# Patient Record
Sex: Female | Born: 2000 | Race: Black or African American | Marital: Single | Smoking: Light tobacco smoker
Health system: Southern US, Community
[De-identification: ages and names within clinical notes are randomized; demographics above are authoritative.]

## PROBLEM LIST (undated history)

## (undated) DIAGNOSIS — T7840XA Allergy, unspecified, initial encounter: Secondary | ICD-10-CM

## (undated) HISTORY — DX: Allergy, unspecified, initial encounter: T78.40XA

---
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
## 2019-07-11 DIAGNOSIS — M79671 Pain in right foot: Secondary | ICD-10-CM | POA: Diagnosis not present

## 2019-12-22 ENCOUNTER — Encounter: Payer: Self-pay | Admitting: Family Medicine

## 2019-12-22 ENCOUNTER — Ambulatory Visit (INDEPENDENT_AMBULATORY_CARE_PROVIDER_SITE_OTHER): Payer: BC Managed Care – PPO | Admitting: Family Medicine

## 2019-12-22 ENCOUNTER — Other Ambulatory Visit: Payer: Self-pay

## 2019-12-22 VITALS — BP 112/72 | HR 64 | Temp 98.6°F | Resp 20 | Ht 69.0 in | Wt 166.0 lb

## 2019-12-22 DIAGNOSIS — M25562 Pain in left knee: Secondary | ICD-10-CM

## 2019-12-22 DIAGNOSIS — G8929 Other chronic pain: Secondary | ICD-10-CM

## 2019-12-22 DIAGNOSIS — M25561 Pain in right knee: Secondary | ICD-10-CM | POA: Diagnosis not present

## 2019-12-22 MED ORDER — DICLOFENAC SODIUM 1 % EX GEL: 2.0000 g | Freq: Four times a day (QID) | CUTANEOUS | 1 refills | Status: DC

## 2019-12-22 MED ORDER — PREDNISONE 20 MG PO TABS
ORAL_TABLET | ORAL | 0 refills | Status: DC
Start: 2019-12-22 — End: 2020-02-04

## 2019-12-22 NOTE — Progress Notes (Signed)
Subjective:  Patient ID: Katherine Dean, female    DOB: 08/15/2001, 18 y.o.   MRN: [id number]  Patient Care Team: Sonny Mastersakes, Criselda Starke M, FNP as PCP - General (Family Medicine)   Chief Complaint:  Establish Care (New ) and bilateral knee pain (L > R)   HPI: Katherine Dean is a 18 y.o. female presenting on 12/22/2019 for Establish Care (New ) and bilateral knee pain (L > R)   Pt presents today to establish care. Pt was seeing Dr. Lysbeth GalasNyland who has retired. Pt states she is doing well overall. States she has ongoing knee pain that started after a basketball injury in 2012. She had a left knee MRI at that time which was unremarkable. Pt states her left knee hurts worse than the right and swells at times. States she has burning and stinging pain after standing on her feet for long hours. She denies new injury, loss of function, decreased ROM. No redness or increased warmth.   Knee Pain  The incident occurred more than 1 week ago. The pain is present in the left knee and right knee. The quality of the pain is described as aching, burning, shooting and stabbing. The pain is at a severity of 5/10. The pain is moderate. The pain has been intermittent since onset. Pertinent negatives include no inability to bear weight, loss of motion, loss of sensation, muscle weakness, numbness or tingling. She reports no foreign bodies present. The symptoms are aggravated by movement and weight bearing. She has tried NSAIDs, heat, ice and immobilization for the symptoms. The treatment provided mild relief.     Relevant past medical, surgical, family, and social history reviewed and updated as indicated.  Allergies and medications reviewed and updated. Date reviewed: Chart in Epic.   History reviewed. No pertinent past medical history.  History reviewed. No pertinent surgical history.  Social History   Socioeconomic History  . Marital status: Single    Spouse name: Not on file  . Number of children: Not on file  .  Years of education: Not on file  . Highest education level: Not on file  Occupational History  . Not on file  Tobacco Use  . Smoking status: Light Tobacco Smoker    Types: Cigars  . Smokeless tobacco: Never Used  Substance and Sexual Activity  . Alcohol use: Never  . Drug use: Never  . Sexual activity: Not on file  Other Topics Concern  . Not on file  Social History Narrative  . Not on file   Social Determinants of Health   Financial Resource Strain:   . Difficulty of Paying Living Expenses: Not on file  Food Insecurity:   . Worried About Programme researcher, broadcasting/film/videounning Out of Food in the Last Year: Not on file  . Ran Out of Food in the Last Year: Not on file  Transportation Needs:   . Lack of Transportation (Medical): Not on file  . Lack of Transportation (Non-Medical): Not on file  Physical Activity:   . Days of Exercise per Week: Not on file  . Minutes of Exercise per Session: Not on file  Stress:   . Feeling of Stress : Not on file  Social Connections:   . Frequency of Communication with Friends and Family: Not on file  . Frequency of Social Gatherings with Friends and Family: Not on file  . Attends Religious Services: Not on file  . Active Member of Clubs or Organizations: Not on file  . Attends BankerClub or Organization Meetings:  Not on file  . Marital Status: Not on file  Intimate Partner Violence:   . Fear of Current or Ex-Partner: Not on file  . Emotionally Abused: Not on file  . Physically Abused: Not on file  . Sexually Abused: Not on file    Outpatient Encounter Medications as of 12/22/2019  Medication Sig  . diclofenac Sodium (VOLTAREN) 1 % GEL Apply 2 g topically 4 (four) times daily.  . predniSONE (DELTASONE) 20 MG tablet 2 po at sametime daily for 5 days   No facility-administered encounter medications on file as of 12/22/2019.    No Known Allergies  Review of Systems  Constitutional: Negative for activity change, appetite change, chills, diaphoresis, fatigue, fever and  unexpected weight change.  HENT: Negative.   Eyes: Negative.  Negative for photophobia and visual disturbance.  Respiratory: Negative for cough, chest tightness and shortness of breath.   Cardiovascular: Negative for chest pain, palpitations and leg swelling.  Gastrointestinal: Negative for abdominal pain, blood in stool, constipation, diarrhea, nausea and vomiting.  Endocrine: Negative.   Genitourinary: Negative for decreased urine volume, difficulty urinating, dysuria, frequency, urgency, vaginal bleeding, vaginal discharge and vaginal pain.  Musculoskeletal: Positive for arthralgias and joint swelling. Negative for back pain, gait problem, myalgias, neck pain and neck stiffness.  Skin: Negative.  Negative for color change, pallor, rash and wound.  Allergic/Immunologic: Negative.   Neurological: Negative for dizziness, tingling, tremors, seizures, syncope, facial asymmetry, speech difficulty, weakness, light-headedness, numbness and headaches.  Hematological: Negative.   Psychiatric/Behavioral: Negative for confusion, hallucinations, sleep disturbance and suicidal ideas.  All other systems reviewed and are negative.       Objective:  BP 112/72   Pulse 64   Temp 98.6 F (37 C)   Resp 20   Ht 5\' 9"  (1.753 m)   Wt 166 lb (75.3 kg)   LMP 12/20/2019   SpO2 94%   BMI 24.51 kg/m    Wt Readings from Last 3 Encounters:  12/22/19 166 lb (75.3 kg)    Physical Exam Vitals and nursing note reviewed.  Constitutional:      General: She is not in acute distress.    Appearance: Normal appearance. She is well-developed and well-groomed. She is not ill-appearing, toxic-appearing or diaphoretic.  HENT:     Head: Normocephalic and atraumatic.     Jaw: There is normal jaw occlusion.     Right Ear: Hearing normal.     Left Ear: Hearing normal.     Nose: Nose normal.     Mouth/Throat:     Lips: Pink.     Mouth: Mucous membranes are moist.     Pharynx: Oropharynx is clear. Uvula midline.    Eyes:     General: Lids are normal.     Extraocular Movements: Extraocular movements intact.     Conjunctiva/sclera: Conjunctivae normal.     Pupils: Pupils are equal, round, and reactive to light.  Neck:     Thyroid: No thyroid mass, thyromegaly or thyroid tenderness.     Vascular: No carotid bruit or JVD.     Trachea: Trachea and phonation normal.  Cardiovascular:     Rate and Rhythm: Normal rate and regular rhythm.     Chest Wall: PMI is not displaced.     Pulses: Normal pulses.     Heart sounds: Normal heart sounds. No murmur. No friction rub. No gallop.   Pulmonary:     Effort: Pulmonary effort is normal. No respiratory distress.     Breath  sounds: Normal breath sounds. No wheezing.  Abdominal:     General: Bowel sounds are normal. There is no distension or abdominal bruit.     Palpations: Abdomen is soft. There is no hepatomegaly or splenomegaly.     Tenderness: There is no abdominal tenderness. There is no right CVA tenderness or left CVA tenderness.     Hernia: No hernia is present.  Musculoskeletal:        General: Normal range of motion.     Cervical back: Normal range of motion and neck supple.     Right upper leg: Normal.     Left upper leg: Normal.     Right knee: No swelling, deformity, effusion, erythema, ecchymosis, lacerations, bony tenderness or crepitus. Normal range of motion. Tenderness present over the medial joint line. No LCL laxity, MCL laxity, ACL laxity or PCL laxity. Normal alignment, normal meniscus and normal patellar mobility. Normal pulse.     Instability Tests: Negative medial McMurray test and negative lateral McMurray test.     Left knee: Swelling present. No deformity, effusion, erythema, ecchymosis, lacerations, bony tenderness or crepitus. Normal range of motion. Tenderness present over the lateral joint line. No LCL laxity, MCL laxity, ACL laxity or PCL laxity.Normal alignment, normal meniscus and normal patellar mobility. Normal pulse.      Instability Tests: Negative medial McMurray test and negative lateral McMurray test.     Right lower leg: Normal. No edema.     Left lower leg: Normal. No edema.  Lymphadenopathy:     Cervical: No cervical adenopathy.  Skin:    General: Skin is warm and dry.     Capillary Refill: Capillary refill takes less than 2 seconds.     Coloration: Skin is not cyanotic, jaundiced or pale.     Findings: No rash.  Neurological:     General: No focal deficit present.     Mental Status: She is alert and oriented to person, place, and time.     Cranial Nerves: Cranial nerves are intact. No cranial nerve deficit.     Sensory: Sensation is intact. No sensory deficit.     Motor: Motor function is intact. No weakness.     Coordination: Coordination is intact. Coordination normal.     Gait: Gait is intact. Gait normal.     Deep Tendon Reflexes: Reflexes are normal and symmetric. Reflexes normal.  Psychiatric:        Attention and Perception: Attention and perception normal.        Mood and Affect: Mood and affect normal.        Speech: Speech normal.        Behavior: Behavior normal. Behavior is cooperative.        Thought Content: Thought content normal.        Cognition and Memory: Cognition and memory normal.        Judgment: Judgment normal.     No results found for this or any previous visit.     Pertinent labs & imaging results that were available during my care of the patient were reviewed by me and considered in my medical decision making.  Assessment & Plan:  Francisca was seen today for establish care and bilateral knee pain.  Diagnoses and all orders for this visit:  Chronic pain of both knees Ongoing bilateral knee pain for several years. Worsens when on feet for over 8 hours. Unable to take NSAIDs orally as they upset her stomach. Will apply bilateral braces today and initiate  topical NSAIDs along with a burst of steroids. Pt to follow up in 6 weeks for reevaluation. May require  imaging and referral to PT and ortho.  -     predniSONE (DELTASONE) 20 MG tablet; 2 po at sametime daily for 5 days -     diclofenac Sodium (VOLTAREN) 1 % GEL; Apply 2 g topically 4 (four) times daily.     Continue all other maintenance medications.  Follow up plan: Return in about 6 weeks (around 02/02/2020), or if symptoms worsen or fail to improve, for knee pain.  Continue healthy lifestyle choices, including diet (rich in fruits, vegetables, and lean proteins, and low in salt and simple carbohydrates) and exercise (at least 30 minutes of moderate physical activity daily).  Educational handout given for knee pain  The above assessment and management plan was discussed with the patient. The patient verbalized understanding of and has agreed to the management plan. Patient is aware to call the clinic if they develop any new symptoms or if symptoms persist or worsen. Patient is aware when to return to the clinic for a follow-up visit. Patient educated on when it is appropriate to go to the emergency department.   Kari Baars, FNP-C [facility] [phone]

## 2019-12-22 NOTE — Patient Instructions (Signed)

## 2019-12-30 ENCOUNTER — Telehealth: Payer: Self-pay | Admitting: Family Medicine

## 2019-12-30 NOTE — Telephone Encounter (Signed)
A referral can be placed for ortho. Does she have a preference?

## 2019-12-30 NOTE — Telephone Encounter (Signed)
Patient states the prednisone does not work at all. The valtren gel was to expensive so she got the pharmacisit to recommend something otc. Patients knee pain is no better. Patient states something needs to be done she has never seen ortho or physical therapy.

## 2019-12-31 NOTE — Telephone Encounter (Signed)
Referral placed.

## 2019-12-31 NOTE — Addendum Note (Signed)
Addended by: Sonny Masters on: 12/31/2019 01:02 PM   Modules accepted: Orders

## 2019-12-31 NOTE — Telephone Encounter (Signed)
Patient states she would like to see someone in the [place] or [place] area.

## 2020-01-02 ENCOUNTER — Ambulatory Visit (INDEPENDENT_AMBULATORY_CARE_PROVIDER_SITE_OTHER): Payer: Self-pay | Admitting: Family Medicine

## 2020-01-02 ENCOUNTER — Other Ambulatory Visit: Payer: Self-pay

## 2020-01-02 ENCOUNTER — Encounter: Payer: Self-pay | Admitting: Family Medicine

## 2020-01-02 DIAGNOSIS — M545 Low back pain, unspecified: Secondary | ICD-10-CM

## 2020-01-02 DIAGNOSIS — M25561 Pain in right knee: Secondary | ICD-10-CM

## 2020-01-02 DIAGNOSIS — M25562 Pain in left knee: Secondary | ICD-10-CM

## 2020-01-02 DIAGNOSIS — G8929 Other chronic pain: Secondary | ICD-10-CM

## 2020-01-02 MED ORDER — TIZANIDINE HCL 2 MG PO TABS: 2.0000 mg | ORAL_TABLET | Freq: Four times a day (QID) | ORAL | 1 refills | Status: DC | PRN

## 2020-01-02 MED ORDER — NABUMETONE 750 MG PO TABS: 750.0000 mg | ORAL_TABLET | Freq: Two times a day (BID) | ORAL | 6 refills | Status: DC | PRN

## 2020-01-02 NOTE — Progress Notes (Signed)
Office Visit Note   Patient: Katherine Dean           Date of Birth: [birth year]           MRN: [id number] Visit Date: 01/02/2020 Requested by: Baruch Gouty, [place],  [place] [address] PCP: Baruch Gouty, FNP  Subjective: Chief Complaint  Patient presents with  . Left Knee - Pain    Chronic pain in the left knee. NKI. Pain all over knee. Has had intermittent pain in the knee since she was in 7th grade. It has gotten worse, especially with working her current job.  . Right Knee - Pain    Pain in posterior knee, starting 2 weeks ago.     HPI: She is here with left greater than right knee pain and low back pain.  In 2012 she developed left knee pain while playing basketball, no definite injury.  She had an MRI scan which showed some bone contusions but no other abnormality.  Over the years she has had intermittent pain in the knee but in the past year it has become more constant now that she is working for [employer].  Her job involves standing for 10 to 12 hours per shift on hard floors.  She has to lean on her right leg because of her left knee pain, and now her right is bothering her almost as much as the left.  She also reports lower lumbar back pain while standing all day at work.  She states that her knee pain is mostly on the posterior aspect but occasionally on the anterior.  No popping or locking, no giving way.  She has not taken medications and has not sought treatment for this since her original injury.               ROS: No fevers or chills.  All other systems were reviewed and are negative.  Objective: Vital Signs: LMP 12/20/2019   Physical Exam:  General:  Alert and oriented, in no acute distress. Pulm:  Breathing unlabored. Psy:  Normal mood, congruent affect. Skin: No rash. Low back: She has hyperlordosis of the lumbar spine.  She is slightly tender near the L5-S1 level.  Stork test is equivocal, straight leg raise is negative.  Lower extremity strength is  normal. Hips: She has limited internal rotation of just over 30 degrees bilaterally.  External rotation is about 70 degrees. Knees: She has wide Q angles bilaterally.  No effusions, ligaments are stable.  No patellofemoral crepitus, no significant joint line tenderness, no pain or click with McMurray's. Feet: She has semirigid pes planus with hyperpronation on weightbearing.  This exaggerates her Q angles.  Imaging: None today  Assessment & Plan: 1.  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  Cannot rule out referred pain from lumbar radiculopathy or possibly a spondylolisthesis. -We will take her out of work for 3 weeks and start physical therapy.  Relafen and Zanaflex as needed. -If she is doing well at 3 weeks, she will go back to work 8-hour shifts for 3 more weeks.  If she tolerates that, then regular duty after that. -If she fails to improve we will obtain three-view bilateral knee x-rays and lumbar x-rays with obliques, possibly do MRI scanning as well depending on the results.       Procedures: No procedures performed  No notes on file     PMFS History: Patient Active Problem  List   Diagnosis Date Noted  . Chronic pain of both knees 12/22/2019   History reviewed. No pertinent past medical history.  Family History  Problem Relation Age of Onset  . Heart attack Brother     History reviewed. No pertinent surgical history. Social History   Occupational History  . Not on file  Tobacco Use  . Smoking status: Light Tobacco Smoker    Types: Cigars  . Smokeless tobacco: Never Used  Substance and Sexual Activity  . Alcohol use: Never  . Drug use: Never  . Sexual activity: Not on file

## 2020-01-07 DIAGNOSIS — M222X2 Patellofemoral disorders, left knee: Secondary | ICD-10-CM | POA: Diagnosis not present

## 2020-01-07 DIAGNOSIS — M222X1 Patellofemoral disorders, right knee: Secondary | ICD-10-CM | POA: Diagnosis not present

## 2020-01-09 DIAGNOSIS — M222X1 Patellofemoral disorders, right knee: Secondary | ICD-10-CM | POA: Diagnosis not present

## 2020-01-09 DIAGNOSIS — M222X2 Patellofemoral disorders, left knee: Secondary | ICD-10-CM | POA: Diagnosis not present

## 2020-01-13 DIAGNOSIS — M222X1 Patellofemoral disorders, right knee: Secondary | ICD-10-CM | POA: Diagnosis not present

## 2020-01-13 DIAGNOSIS — M222X2 Patellofemoral disorders, left knee: Secondary | ICD-10-CM | POA: Diagnosis not present

## 2020-01-15 DIAGNOSIS — M222X1 Patellofemoral disorders, right knee: Secondary | ICD-10-CM | POA: Diagnosis not present

## 2020-01-15 DIAGNOSIS — M222X2 Patellofemoral disorders, left knee: Secondary | ICD-10-CM | POA: Diagnosis not present

## 2020-01-20 DIAGNOSIS — M222X1 Patellofemoral disorders, right knee: Secondary | ICD-10-CM | POA: Diagnosis not present

## 2020-01-20 DIAGNOSIS — M222X2 Patellofemoral disorders, left knee: Secondary | ICD-10-CM | POA: Diagnosis not present

## 2020-01-22 DIAGNOSIS — M222X1 Patellofemoral disorders, right knee: Secondary | ICD-10-CM | POA: Diagnosis not present

## 2020-01-22 DIAGNOSIS — M222X2 Patellofemoral disorders, left knee: Secondary | ICD-10-CM | POA: Diagnosis not present

## 2020-01-27 ENCOUNTER — Ambulatory Visit: Payer: Self-pay

## 2020-01-27 ENCOUNTER — Encounter: Payer: Self-pay | Admitting: Family Medicine

## 2020-01-27 ENCOUNTER — Ambulatory Visit (INDEPENDENT_AMBULATORY_CARE_PROVIDER_SITE_OTHER): Payer: 59 | Admitting: Family Medicine

## 2020-01-27 ENCOUNTER — Other Ambulatory Visit: Payer: Self-pay

## 2020-01-27 DIAGNOSIS — M25561 Pain in right knee: Secondary | ICD-10-CM

## 2020-01-27 DIAGNOSIS — M25562 Pain in left knee: Secondary | ICD-10-CM | POA: Diagnosis not present

## 2020-01-27 DIAGNOSIS — M222X2 Patellofemoral disorders, left knee: Secondary | ICD-10-CM | POA: Diagnosis not present

## 2020-01-27 DIAGNOSIS — G8929 Other chronic pain: Secondary | ICD-10-CM | POA: Diagnosis not present

## 2020-01-27 DIAGNOSIS — M222X1 Patellofemoral disorders, right knee: Secondary | ICD-10-CM | POA: Diagnosis not present

## 2020-01-27 MED ORDER — CELECOXIB 200 MG PO CAPS: 200.0000 mg | ORAL_CAPSULE | Freq: Two times a day (BID) | ORAL | 6 refills | Status: DC | PRN

## 2020-01-27 NOTE — Progress Notes (Signed)
I saw and examined the patient with Dr. Robby Sermon and agree with assessment and plan as outlined.  Temporary improvement with PT, but still having a lot of trouble at work.    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  Knees have patellofemoral maltracking.    Will continue with PT and home exercises; try Hapad arch supports (possible custom orthotics).    Consider new MRI scans if not improved in a few months.

## 2020-01-27 NOTE — Progress Notes (Signed)
Office Visit Note   Patient: Katherine Dean           Date of Birth: 02/23/2001           MRN: [id number] Visit Date: 01/27/2020 Requested by: Baruch Gouty, [place],  [place] [address] PCP: Baruch Gouty, FNP  Subjective: Chief Complaint  Patient presents with  . Left Knee - Pain    "knee is the same"  PT helps the day of, but symptoms come back    HPI: She is for follow up of bilateral knee pain, left worse than right.    Chronic knee pain since 2012 while playing basketball. No definite injury but she did report intermittent swelling in knee. MRI obtained at the time which showed bone contusions, no other abnormality. Pain has been intermittent, more constant since starteing work at [employer] as she stands for 10 hours on hard floors and leans on right leg as left knee has had more pain. She has been going to PT and overall feels like things are about the same. Pain is a little better on day of therapy. She has been taking Tizanidine and Relafen but is unsure if it is helping.            ROS: No fevers or chills.  All other systems were reviewed and are negative.  Objective: Vital Signs: There were no vitals taken for this visit.  Physical Exam:  General:  Alert and oriented, in no acute distress. Pulm:  Breathing unlabored. Psy:  Normal mood, congruent affect. Skin: No rash. Low back: She has hyperlordosis of the lumbar spine.  She is slightly tender near the L5-S1 level.  Stork test is equivocal, straight leg raise is negative.  Lower extremity strength is normal. Hips: IR to 45 degrees on left side, 40 degrees on right side, ER rotation ~70 degrees. Knees: She has wide Q angles bilaterally.  No effusions, ligaments are stable.  No patellofemoral crepitus, no significant joint line tenderness, no pain or click with McMurray's. Feet: She has semirigid pes planus with hyperpronation on weightbearing.    Gait: genus valgus, hyperpronation of feet. Slight antalgic  gait favoring left side  Imaging: None today  Assessment & Plan: Chronic bilateral knee pain- patellofemoral  1.  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  Cannot rule out referred pain from lumbar radiculopathy or possibly a spondylolisthesis. -We will take her out of work for 3 weeks and start physical therapy.  Relafen and Zanaflex as needed. -If she is doing well at 3 weeks, she will go back to work 8-hour shifts for 3 more weeks.  If she tolerates that, then regular duty after that. -If she fails to improve we will obtain three-view bilateral knee x-rays and lumbar x-rays with obliques, possibly do MRI scanning as well depending on the results.   Procedures: No procedures performed  No notes on file     PMFS History: Patient Active Problem List   Diagnosis Date Noted  . Chronic pain of both knees 12/22/2019   History reviewed. No pertinent past medical history.  Family History  Problem Relation Age of Onset  . Heart attack Brother     History reviewed. No pertinent surgical history. Social History   Occupational History  . Not on file  Tobacco Use  . Smoking status: Light Tobacco Smoker    Types: Cigars  . Smokeless tobacco: Never Used  Substance and  Sexual Activity  . Alcohol use: Never  . Drug use: Never  . Sexual activity: Not on file

## 2020-01-29 DIAGNOSIS — M222X1 Patellofemoral disorders, right knee: Secondary | ICD-10-CM | POA: Diagnosis not present

## 2020-01-29 DIAGNOSIS — M222X2 Patellofemoral disorders, left knee: Secondary | ICD-10-CM | POA: Diagnosis not present

## 2020-02-03 DIAGNOSIS — M222X1 Patellofemoral disorders, right knee: Secondary | ICD-10-CM | POA: Diagnosis not present

## 2020-02-03 DIAGNOSIS — M222X2 Patellofemoral disorders, left knee: Secondary | ICD-10-CM | POA: Diagnosis not present

## 2020-02-04 ENCOUNTER — Encounter: Payer: Self-pay | Admitting: Family Medicine

## 2020-02-04 ENCOUNTER — Other Ambulatory Visit: Payer: Self-pay

## 2020-02-04 ENCOUNTER — Ambulatory Visit (INDEPENDENT_AMBULATORY_CARE_PROVIDER_SITE_OTHER): Payer: BC Managed Care – PPO | Admitting: Family Medicine

## 2020-02-04 DIAGNOSIS — M545 Low back pain, unspecified: Secondary | ICD-10-CM

## 2020-02-04 DIAGNOSIS — G8929 Other chronic pain: Secondary | ICD-10-CM | POA: Diagnosis not present

## 2020-02-04 DIAGNOSIS — M25561 Pain in right knee: Secondary | ICD-10-CM

## 2020-02-04 DIAGNOSIS — M25562 Pain in left knee: Secondary | ICD-10-CM

## 2020-02-04 NOTE — Progress Notes (Signed)
   Office Visit Note   Patient: Katherine Dean           Date of Birth: [birth year]           MRN: [id number] Visit Date: 02/04/2020 Requested by: Sonny Masters, FNP [address] [place],  [place] [address] PCP: Sonny Masters, FNP  Subjective: Chief Complaint  Patient presents with  . Lower Back - Pain, Follow-up    Pain is improving with PT - goes every Tues & Thurs. Still has the pain in the right lower back.  . Left Knee - Pain, Follow-up    Knee is not improving much yet.    HPI: She is here for follow-up chronic low back pain and bilateral knee pain.  Since last visit she has been doing physical therapy.  She feels like it is helping to some degree with her low back pain, and may be a little bit with her knees.  They are doing taping of her knees and that makes them feel better.  She tried going back to work with modified hours, and tolerated 2 days but the third day she had to stay home.  She does not think she will be able to do her current job at this point, until she feels better.              ROS: No fevers or chills.  All other systems were reviewed and are negative.  Objective: Vital Signs: There were no vitals taken for this visit.  Physical Exam:  General:  Alert and oriented, in no acute distress. Pulm:  Breathing unlabored. Psy:  Normal mood, congruent affect. Low back: Tender to palpation in the paraspinous muscles to the right of midline at about L3-4. Knees: No effusion, trace patellofemoral crepitus.  Mild pain with patellar compression on both sides.  Full range of motion.  Imaging: None today.  Assessment & Plan: 1.  Chronic low back pain, possibly myofascial but cannot rule out facet syndrome -MRI to evaluate.  Continue with physical therapy.  2.  Chronic bilateral knee pain -Continue with physical therapy.  Out of work for 1 month. -If she fails to get adequate relief with PT, then possibly MRI scan.     Procedures: No procedures performed  No  notes on file     PMFS History: Patient Active Problem List   Diagnosis Date Noted  . Chronic pain of both knees 12/22/2019   History reviewed. No pertinent past medical history.  Family History  Problem Relation Age of Onset  . Heart attack Brother     History reviewed. No pertinent surgical history. Social History   Occupational History  . Not on file  Tobacco Use  . Smoking status: Light Tobacco Smoker    Types: Cigars  . Smokeless tobacco: Never Used  Substance and Sexual Activity  . Alcohol use: Never  . Drug use: Never  . Sexual activity: Not on file

## 2020-02-05 ENCOUNTER — Encounter: Payer: Self-pay | Admitting: Family Medicine

## 2020-02-05 DIAGNOSIS — M222X2 Patellofemoral disorders, left knee: Secondary | ICD-10-CM | POA: Diagnosis not present

## 2020-02-05 DIAGNOSIS — M222X1 Patellofemoral disorders, right knee: Secondary | ICD-10-CM | POA: Diagnosis not present

## 2020-02-05 NOTE — Telephone Encounter (Signed)
Order cancelled as requested by Dr. Prince Rome

## 2020-02-10 DIAGNOSIS — M222X1 Patellofemoral disorders, right knee: Secondary | ICD-10-CM | POA: Diagnosis not present

## 2020-02-10 DIAGNOSIS — M222X2 Patellofemoral disorders, left knee: Secondary | ICD-10-CM | POA: Diagnosis not present

## 2020-02-16 DIAGNOSIS — M222X2 Patellofemoral disorders, left knee: Secondary | ICD-10-CM | POA: Diagnosis not present

## 2020-02-16 DIAGNOSIS — M222X1 Patellofemoral disorders, right knee: Secondary | ICD-10-CM | POA: Diagnosis not present

## 2020-02-23 ENCOUNTER — Encounter: Payer: Self-pay | Admitting: Family Medicine

## 2020-02-23 DIAGNOSIS — G8929 Other chronic pain: Secondary | ICD-10-CM

## 2020-02-23 DIAGNOSIS — M25562 Pain in left knee: Secondary | ICD-10-CM

## 2020-02-23 DIAGNOSIS — M545 Low back pain, unspecified: Secondary | ICD-10-CM

## 2020-02-23 NOTE — Addendum Note (Signed)
Addended by: Lillia Carmel on: 02/23/2020 04:34 PM   Modules accepted: Orders

## 2020-02-25 ENCOUNTER — Encounter: Payer: Self-pay | Admitting: Family Medicine

## 2020-02-27 ENCOUNTER — Encounter: Payer: Self-pay | Admitting: *Deleted

## 2020-03-03 ENCOUNTER — Ambulatory Visit (INDEPENDENT_AMBULATORY_CARE_PROVIDER_SITE_OTHER): Payer: BC Managed Care – PPO | Admitting: Family Medicine

## 2020-03-03 ENCOUNTER — Encounter: Payer: Self-pay | Admitting: Family Medicine

## 2020-03-03 ENCOUNTER — Other Ambulatory Visit: Payer: Self-pay

## 2020-03-03 DIAGNOSIS — M25562 Pain in left knee: Secondary | ICD-10-CM | POA: Diagnosis not present

## 2020-03-03 DIAGNOSIS — M25561 Pain in right knee: Secondary | ICD-10-CM

## 2020-03-03 DIAGNOSIS — M545 Low back pain, unspecified: Secondary | ICD-10-CM

## 2020-03-03 DIAGNOSIS — G8929 Other chronic pain: Secondary | ICD-10-CM

## 2020-03-03 NOTE — Progress Notes (Signed)
   Office Visit Note   Patient: Katherine Dean           Date of Birth: Oct 04, 2001           MRN: [id number] Visit Date: 03/03/2020 Requested by: Katherine Masters, FNP [address] [place],  [place] [address] PCP: Katherine Masters, FNP  Subjective: Chief Complaint  Patient presents with  . Lower Back - Pain, Follow-up    MRIs of both the back and the knee are not until 03/23/20. Naproxen has not been helping. Will pick up the celebrex Rx today from the pharmacy.  . Left Knee - Pain, Follow-up    HPI: She is here for follow-up chronic low back pain and bilateral knee pain.  She remains out of work.  She is doing physical therapy, has been through 12 sessions and has had some improvement in her pain but not a lot.  Even when not working, she is still having pain.  She is scheduled for MRI scans March 30.              ROS:   All other systems were reviewed and are negative.  Objective: Vital Signs: There were no vitals taken for this visit.  Physical Exam:  General:  Alert and oriented, in no acute distress. Pulm:  Breathing unlabored. Psy:  Normal mood, congruent affect.  No exam done today.   Assessment & Plan: 1.  Chronic low back and bilateral knee pain -We will keep her out of work until April 8. -We discussed the possibility that her MRI scans will be normal/negative.  If that is the case, then it will not mean that she does not have pain, but that she does not need surgical intervention. -She may need to pursue a different occupation.  We may need to try other treatment modalities.  For now she will continue with physical therapy.     Procedures: No procedures performed  No notes on file     PMFS History: Patient Active Problem List   Diagnosis Date Noted  . Chronic pain of both knees 12/22/2019   History reviewed. No pertinent past medical history.  Family History  Problem Relation Age of Onset  . Heart attack Brother     History reviewed. No pertinent surgical  history. Social History   Occupational History  . Not on file  Tobacco Use  . Smoking status: Light Tobacco Smoker    Types: Cigars  . Smokeless tobacco: Never Used  Substance and Sexual Activity  . Alcohol use: Never  . Drug use: Never  . Sexual activity: Not on file

## 2020-03-23 ENCOUNTER — Other Ambulatory Visit: Payer: Self-pay

## 2020-03-23 ENCOUNTER — Ambulatory Visit
Admission: RE | Admit: 2020-03-23 | Discharge: 2020-03-23 | Disposition: A | Discharge status: Home / Self Care | Payer: BC Managed Care – PPO | Source: Ambulatory Visit | Attending: Family Medicine | Admitting: Family Medicine

## 2020-03-23 DIAGNOSIS — G8929 Other chronic pain: Secondary | ICD-10-CM

## 2020-03-23 DIAGNOSIS — M48061 Spinal stenosis, lumbar region without neurogenic claudication: Secondary | ICD-10-CM | POA: Diagnosis not present

## 2020-03-23 DIAGNOSIS — M545 Low back pain, unspecified: Secondary | ICD-10-CM

## 2020-03-23 DIAGNOSIS — M25562 Pain in left knee: Secondary | ICD-10-CM | POA: Diagnosis not present

## 2020-03-24 ENCOUNTER — Telehealth: Payer: Self-pay | Admitting: Family Medicine

## 2020-03-24 NOTE — Telephone Encounter (Signed)
Lumbar and knee MRI scans look normal.

## 2020-03-30 ENCOUNTER — Encounter: Payer: Self-pay | Admitting: Family Medicine

## 2020-03-31 ENCOUNTER — Encounter: Payer: Self-pay | Admitting: Family Medicine

## 2020-03-31 ENCOUNTER — Ambulatory Visit (INDEPENDENT_AMBULATORY_CARE_PROVIDER_SITE_OTHER): Payer: BC Managed Care – PPO | Admitting: Family Medicine

## 2020-03-31 ENCOUNTER — Other Ambulatory Visit: Payer: Self-pay

## 2020-03-31 DIAGNOSIS — M25562 Pain in left knee: Secondary | ICD-10-CM | POA: Diagnosis not present

## 2020-03-31 DIAGNOSIS — G8929 Other chronic pain: Secondary | ICD-10-CM

## 2020-03-31 DIAGNOSIS — M25561 Pain in right knee: Secondary | ICD-10-CM

## 2020-03-31 NOTE — Progress Notes (Signed)
   Office Visit Note   Patient: Katherine Dean           Date of Birth: [birth year]           MRN: [id number] Visit Date: 03/31/2020 Requested by: No referring provider defined for this encounter. PCP: Patient, No Pcp Per  Subjective: Chief Complaint  Patient presents with  . Left Knee - Follow-up    HPI: She is here with persistent bilateral knee pain.  She is discouraged that her MRI scans looked normal.  She has not yet quit her job, is interested in getting a second opinion just to make sure the diagnosis is correct.              ROS:   All other systems were reviewed and are negative.  Objective: Vital Signs: There were no vitals taken for this visit.  Physical Exam:  General:  Alert and oriented, in no acute distress. Pulm:  Breathing unlabored. Psy:  Normal mood, congruent affect.  No exam done today.  Imaging: None today  Assessment & Plan: 1.  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  Out of work for 2 months while awaiting this appointment.     Procedures: No procedures performed  No notes on file     PMFS History: Patient Active Problem List   Diagnosis Date Noted  . Chronic pain of both knees 12/22/2019   History reviewed. No pertinent past medical history.  Family History  Problem Relation Age of Onset  . Heart attack Brother     History reviewed. No pertinent surgical history. Social History   Occupational History  . Not on file  Tobacco Use  . Smoking status: Light Tobacco Smoker    Types: Cigars  . Smokeless tobacco: Never Used  Substance and Sexual Activity  . Alcohol use: Never  . Drug use: Never  . Sexual activity: Not on file

## 2020-03-31 NOTE — Patient Instructions (Signed)
    Dr. Beverely Low - [facility]

## 2020-04-04 DIAGNOSIS — Z03818 Encounter for observation for suspected exposure to other biological agents ruled out: Secondary | ICD-10-CM | POA: Diagnosis not present

## 2020-04-04 DIAGNOSIS — U071 COVID-19: Secondary | ICD-10-CM | POA: Diagnosis not present

## 2020-04-04 DIAGNOSIS — Z20828 Contact with and (suspected) exposure to other viral communicable diseases: Secondary | ICD-10-CM | POA: Diagnosis not present

## 2020-04-27 ENCOUNTER — Telehealth: Payer: Self-pay

## 2020-04-27 NOTE — Telephone Encounter (Signed)
Ic,spoke with patient. She did write in 2 fax numbers but only the name to release to [facility]. She said she didn't know she needed 2 separate forms. Records need to be faxed for her job while she was out of work. I accepted verbal authorization to fax to [facility] and faxed records to [phone] (this is number on release)

## 2020-04-27 NOTE — Telephone Encounter (Signed)
Patient called stated that she signed a release form on 04/22/20.  She is asking for her records to be sent to her job from 03/03/20-04/01/20.  She said the fax number was on the form she signed.

## 2020-05-06 ENCOUNTER — Ambulatory Visit (INDEPENDENT_AMBULATORY_CARE_PROVIDER_SITE_OTHER): Payer: BC Managed Care – PPO | Admitting: Orthopaedic Surgery

## 2020-05-06 ENCOUNTER — Other Ambulatory Visit: Payer: Self-pay

## 2020-05-06 VITALS — BP 107/62 | HR 78 | Ht 70.0 in | Wt 165.0 lb

## 2020-05-06 DIAGNOSIS — G8929 Other chronic pain: Secondary | ICD-10-CM | POA: Diagnosis not present

## 2020-05-06 DIAGNOSIS — M25561 Pain in right knee: Secondary | ICD-10-CM

## 2020-05-06 DIAGNOSIS — M25562 Pain in left knee: Secondary | ICD-10-CM | POA: Diagnosis not present

## 2020-05-06 NOTE — Progress Notes (Signed)
Office Visit Note   Patient: Katherine Dean           Date of Birth: 05/20/2001           MRN: [id number] Visit Date: 05/06/2020              Requested by: No referring provider defined for this encounter. PCP: Patient, No Pcp Per   Assessment & Plan: Visit Diagnoses: No diagnosis found.  Plan: We discussed patient needs to work hard on quadricep strengthening and gave her multiple exercises that she can work on.  We discussed the importance of maintaining good quadricep strength to help with patellofemoral tracking and her symptoms in the end of the day are likely related to mild quadricep atrophy.  We can recheck her in 2 months.  Follow-Up Instructions: Return in 2 months (on 07/06/2020), or if symptoms worsen or fail to improve.   Orders:  No orders of the defined types were placed in this encounter.  No orders of the defined types were placed in this encounter.     Procedures: No procedures performed   Clinical Data: No additional findings.   Subjective: Chief Complaint  Patient presents with  . Left Knee - Pain    HPI 19 year old female with problems with bilateral knee pain primarily anteriorly.  She has more pain in the left and right had an MRI scan done on 03/24/2020 which was normal.  MRI lumbar spine 03/24/2020 was normal.  Patient's been treated with anti-inflammatories exercise program.  She states she continues to have significant problems with her knees.  Patient has been through therapy for her back of her knee she has been through taping and multiple visits.  She states she has had pain in her knee since she was 19 years old.  Patient now works for [employer] and is on her feet for the entire shift.  She has been leaning mostly on her right leg and states now her right knee is beginning to hurt her more anteriorly since she is trying to unload her left which was previously more painful.  Review of Systems no rash no evidence of iritis no other joint symptoms other  than as mentioned HPI.  All other systems are negative.   Objective: Vital Signs: BP 107/62   Pulse 78   Ht 5\' 10"  (1.778 m)   Wt 165 lb (74.8 kg)   BMI 23.68 kg/m   Physical Exam Constitutional:      Appearance: She is well-developed.  HENT:     Head: Normocephalic.     Right Ear: External ear normal.     Left Ear: External ear normal.  Eyes:     Pupils: Pupils are equal, round, and reactive to light.  Neck:     Thyroid: No thyromegaly.     Trachea: No tracheal deviation.  Cardiovascular:     Rate and Rhythm: Normal rate.  Pulmonary:     Effort: Pulmonary effort is normal.  Abdominal:     Palpations: Abdomen is soft.  Skin:    General: Skin is warm and dry.  Neurological:     Mental Status: She is alert and oriented to person, place, and time.  Psychiatric:        Behavior: Behavior normal.     Ortho Exam patient has mild VMO atrophy on the left normal patella tracking a without apprehension test.  Mild crepitus with patellofemoral loading and quadriceps contracture.  No medial plica collateral cruciate ligament exam is normal negative  logroll of the hips no static notch tenderness.  In sitting position with flexion extension she has some tremoring of the quadriceps against mild to finger resistance.  This is present in the left knee only not on the right knee.  Specialty Comments:  No specialty comments available.  Imaging: No results found.   PMFS History: Patient Active Problem List   Diagnosis Date Noted  . Chronic pain of both knees 12/22/2019   No past medical history on file.  Family History  Problem Relation Age of Onset  . Heart attack Brother     No past surgical history on file. Social History   Occupational History  . Not on file  Tobacco Use  . Smoking status: Light Tobacco Smoker    Types: Cigars  . Smokeless tobacco: Never Used  Substance and Sexual Activity  . Alcohol use: Never  . Drug use: Never  . Sexual activity: Not on file

## 2020-05-12 ENCOUNTER — Ambulatory Visit: Payer: BC Managed Care – PPO | Admitting: Orthopedic Surgery

## 2020-05-12 ENCOUNTER — Other Ambulatory Visit: Payer: Self-pay

## 2020-05-12 DIAGNOSIS — G8929 Other chronic pain: Secondary | ICD-10-CM

## 2020-05-12 DIAGNOSIS — M25562 Pain in left knee: Secondary | ICD-10-CM | POA: Diagnosis not present

## 2020-05-14 ENCOUNTER — Encounter: Payer: Self-pay | Admitting: Orthopedic Surgery

## 2020-05-14 NOTE — Progress Notes (Signed)
Office Visit Note   Patient: Katherine Dean           Date of Birth: 07-07-2001           MRN: [id number] Visit Date: 05/12/2020 Requested by: No referring provider defined for this encounter. PCP: Patient, No Pcp Per  Subjective: Chief Complaint  Patient presents with  . Left Knee - Pain    HPI: Katherine Dean is a 19 year old female with left knee pain.  She is here for another opinion about her knee.  Injured her knee previously playing basketball in 2011.  She is had an MRI of her lumbar spine which was normal.  MRI of the left knee also normal.  She was recommended to do quad strengthening exercises.  Never had an injection and does not really like to take anti-inflammatories.  Denies any groin pain.  She works doing Chiropractor work with a Transport planner.  Does not do any running.  In general she says she is getting a little bit better.  She has tried prednisone before which did not help.  Localizes the pain primarily to the anterior aspect of the knee.              ROS: All systems reviewed are negative as they relate to the chief complaint within the history of present illness.  Patient denies  fevers or chills.   Assessment & Plan: Visit Diagnoses:  1. Chronic pain of left knee     Plan: Impression is left knee pain with normal MRI scan of the lumbar spine and no actionable findings on MRI scan of the left knee.  Sometimes this knee pain is just inflamed tissue that does not really have a structural abnormality on MRI scan.  I think if her knee really starts hurting then injection is indicated.  No evidence that this is referred pain from her back or hip at this time.  In general is getting a little bit better.  No surgical indication at this time.  Follow-up with [place] as needed.  Follow-Up Instructions: Return if symptoms worsen or fail to improve.   Orders:  No orders of the defined types were placed in this encounter.  No orders of the defined types were placed in this encounter.     Procedures: No procedures performed   Clinical Data: No additional findings.  Objective: Vital Signs: There were no vitals taken for this visit.  Physical Exam:   Constitutional: Patient appears well-developed HEENT:  Head: Normocephalic Eyes:EOM are normal Neck: Normal range of motion Cardiovascular: Normal rate Pulmonary/chest: Effort normal Neurologic: Patient is alert Skin: Skin is warm Psychiatric: Patient has normal mood and affect    Ortho Exam: Ortho exam demonstrates normal gait alignment.  Excellent quad strength bilaterally.  No effusion in either knee.  No patellar or quad tendon tenderness on that left knee.  Range of motion is full.  No focal joint line tenderness.  No groin pain with internal X rotation leg.  No nerve root tension signs.  No other masses lymphadenopathy or skin changes noted in that left knee region  Specialty Comments:  No specialty comments available.  Imaging: No results found.   PMFS History: Patient Active Problem List   Diagnosis Date Noted  . Chronic pain of both knees 12/22/2019   History reviewed. No pertinent past medical history.  Family History  Problem Relation Age of Onset  . Heart attack Brother     History reviewed. No pertinent surgical history. Social History  Occupational History  . Not on file  Tobacco Use  . Smoking status: Light Tobacco Smoker    Types: Cigars  . Smokeless tobacco: Never Used  Substance and Sexual Activity  . Alcohol use: Never  . Drug use: Never  . Sexual activity: Not on file

## 2021-02-07 ENCOUNTER — Other Ambulatory Visit: Payer: Self-pay

## 2021-02-07 ENCOUNTER — Encounter: Payer: Self-pay | Admitting: Nurse Practitioner

## 2021-02-07 ENCOUNTER — Ambulatory Visit (INDEPENDENT_AMBULATORY_CARE_PROVIDER_SITE_OTHER): Payer: Managed Care, Other (non HMO) | Admitting: Nurse Practitioner

## 2021-02-07 VITALS — BP 121/68 | HR 80 | Temp 98.2°F | Ht 70.0 in | Wt 158.2 lb

## 2021-02-07 DIAGNOSIS — Z0000 Encounter for general adult medical examination without abnormal findings: Secondary | ICD-10-CM

## 2021-02-07 DIAGNOSIS — F17200 Nicotine dependence, unspecified, uncomplicated: Secondary | ICD-10-CM | POA: Diagnosis not present

## 2021-02-07 DIAGNOSIS — Z0001 Encounter for general adult medical examination with abnormal findings: Secondary | ICD-10-CM | POA: Diagnosis not present

## 2021-02-07 DIAGNOSIS — Z7689 Persons encountering health services in other specified circumstances: Secondary | ICD-10-CM | POA: Insufficient documentation

## 2021-02-07 MED ORDER — NICOTINE 21 MG/24HR TD PT24
21.0000 mg | MEDICATED_PATCH | Freq: Every day | TRANSDERMAL | 0 refills | Status: DC
Start: 2021-02-07 — End: 2021-06-07

## 2021-02-07 NOTE — Assessment & Plan Note (Signed)
Completed annual physical exam.  Labs completed.  Patient is a healthy young female with no new concerns except 11 pound weight loss without intentions to lose weight.  Patient reports eating only 1 meal a day.  Advised patient to increase nutrition and with reduced food intake, it  will lead to weight loss.  Patient verbalized understanding. -CBC, CMP, lipid panel, HIV antibody., hepatitis C.

## 2021-02-07 NOTE — Assessment & Plan Note (Signed)
Tobacco cessation education provided to patient with printed handouts given.  Patient is willing to start tobacco cessation.  Nicotine 21 mcg patch started.  Rx sent to pharmacy. Follow-up in 3 months.

## 2021-02-07 NOTE — Progress Notes (Signed)
New Patient Office Visit  Subjective:  Patient ID: Katherine Dean, female    DOB: 09-Jul-2001  Age: 20 y.o. MRN: [id number]  CC:  Chief Complaint  Patient presents with  . Annual Exam    HPI [name] presents for .   Encounter for general adult medical examination  Physical: Patient's last physical exam was 1 year ago .  Weight: Appropriate for height (BMI less than 27%) ;  Blood Pressure: Normal (BP less than 120/80) ;  Medical History: Patient history reviewed ; Family history reviewed ;  Allergies Reviewed: No change in current allergies ;  Medications Reviewed: Medications reviewed - no changes ;  Lipids: Normal lipid levels ; labs completed Smoking: Life-long smoker ; 7 years Physical Activity: Exercises at least 3 times per week ;  Alcohol/Drug Use: Is a non-drinker ; illicit drug use ; marijuana Patient is not afflicted from Stress Incontinence and Urge Incontinence  Safety: reviewed ; Patient wears a seat belt, has smoke detectors, has carbon monoxide detectors and wears sunscreen with extended sun exposure. Dental Care: biannual cleanings, brushes and flosses daily. Ophthalmology/Optometry: as needed  Hearing loss: none Vision impairments: none  Past Medical History:  Diagnosis Date  . Allergy       Family History  Problem Relation Age of Onset  . Heart attack Brother     Social History   Socioeconomic History  . Marital status: Single    Spouse name: Not on file  . Number of children: Not on file  . Years of education: Not on file  . Highest education level: Not on file  Occupational History  . Not on file  Tobacco Use  . Smoking status: Light Tobacco Smoker    Types: Cigars  . Smokeless tobacco: Never Used  Vaping Use  . Vaping Use: Never used  Substance and Sexual Activity  . Alcohol use: Never  . Drug use: Never  . Sexual activity: Not on file  Other Topics Concern  . Not on file  Social History Narrative  . Not on file   Social  Determinants of Health   Financial Resource Strain: Not on file  Food Insecurity: Not on file  Transportation Needs: Not on file  Physical Activity: Not on file  Stress: Not on file  Social Connections: Not on file  Intimate Partner Violence: Not on file    ROS Review of Systems  Constitutional: Negative.   HENT: Negative.   Respiratory: Negative.   Cardiovascular: Negative.   Musculoskeletal: Negative.   Skin: Negative.   Neurological: Negative.   Hematological: Negative.   All other systems reviewed and are negative.   Objective:   Today's Vitals: BP 121/68   Pulse 80   Temp 98.2 F (36.8 C)   Ht 5\' 10"  (1.778 m)   Wt 158 lb 3.2 oz (71.8 kg)   LMP 01/11/2021   SpO2 100%   BMI 22.70 kg/m   Physical Exam Vitals reviewed.  Constitutional:      Appearance: Normal appearance.  HENT:     Head: Normocephalic.     Nose: Nose normal.  Eyes:     Conjunctiva/sclera: Conjunctivae normal.     Pupils: Pupils are equal, round, and reactive to light.  Cardiovascular:     Rate and Rhythm: Normal rate and regular rhythm.     Pulses: Normal pulses.     Heart sounds: Normal heart sounds.  Pulmonary:     Effort: Pulmonary effort is normal.     Breath  sounds: Normal breath sounds.  Abdominal:     General: Bowel sounds are normal.  Musculoskeletal:        General: Normal range of motion.     Cervical back: Normal range of motion.  Skin:    General: Skin is warm.  Neurological:     Mental Status: She is alert and oriented to person, place, and time.  Psychiatric:        Mood and Affect: Mood normal.        Behavior: Behavior normal.     Assessment & Plan:   Problem List Items Addressed This Visit      Other   Annual physical exam - Primary    Completed annual physical exam.  Labs completed.  Patient is a healthy young female with no new concerns except 11 pound weight loss without intentions to lose weight.  Patient reports eating only 1 meal a day.  Advised  patient to increase nutrition and with reduced food intake, it  will lead to weight loss.  Patient verbalized understanding. -CBC, CMP, lipid panel, HIV antibody., hepatitis C.      Relevant Orders   CBC with Differential   Comprehensive metabolic panel   Lipid Panel   HIV antibody (with reflex)   Hepatitis C antibody   Needs smoking cessation education    Tobacco cessation education provided to patient with printed handouts given.  Patient is willing to start tobacco cessation.  Nicotine 21 mcg patch started.  Rx sent to pharmacy. Follow-up in 3 months.      Relevant Medications   nicotine (NICODERM CQ - DOSED IN MG/24 HOURS) 21 mg/24hr patch      Outpatient Encounter Medications as of 02/07/2021  Medication Sig  . nicotine (NICODERM CQ - DOSED IN MG/24 HOURS) 21 mg/24hr patch Place 1 patch (21 mg total) onto the skin daily.  . [DISCONTINUED] celecoxib (CELEBREX) 200 MG capsule Take 1 capsule (200 mg total) by mouth 2 (two) times daily as needed. (Patient not taking: Reported on 02/04/2020)  . [DISCONTINUED] diclofenac Sodium (VOLTAREN) 1 % GEL Apply 2 g topically 4 (four) times daily. (Patient not taking: Reported on 02/04/2020)  . [DISCONTINUED] nabumetone (RELAFEN) 750 MG tablet Take 1 tablet (750 mg total) by mouth 2 (two) times daily as needed.  . [DISCONTINUED] tiZANidine (ZANAFLEX) 2 MG tablet Take 1-2 tablets (2-4 mg total) by mouth every 6 (six) hours as needed for muscle spasms. (Patient not taking: Reported on 01/27/2020)   No facility-administered encounter medications on file as of 02/07/2021.    Follow-up: Return in about 6 weeks (around 03/21/2021).   Daryll Drown, NP

## 2021-02-07 NOTE — Patient Instructions (Addendum)
Follow up smoking cessation in 3 months.  Health Maintenance, Female Adopting a healthy lifestyle and getting preventive care are important in promoting health and wellness. Ask your health care provider about:  The right schedule for you to have regular tests and exams.  Things you can do on your own to prevent diseases and keep yourself healthy. What should I know about diet, weight, and exercise? Eat a healthy diet  Eat a diet that includes plenty of vegetables, fruits, low-fat dairy products, and lean protein.  Do not eat a lot of foods that are high in solid fats, added sugars, or sodium.   Maintain a healthy weight Body mass index (BMI) is used to identify weight problems. It estimates body fat based on height and weight. Your health care provider can help determine your BMI and help you achieve or maintain a healthy weight. Get regular exercise Get regular exercise. This is one of the most important things you can do for your health. Most adults should:  Exercise for at least 150 minutes each week. The exercise should increase your heart rate and make you sweat (moderate-intensity exercise).  Do strengthening exercises at least twice a week. This is in addition to the moderate-intensity exercise.  Spend less time sitting. Even light physical activity can be beneficial. Watch cholesterol and blood lipids Have your blood tested for lipids and cholesterol at 22 years of age, then have this test every 5 years. Have your cholesterol levels checked more often if:  Your lipid or cholesterol levels are high.  You are older than 22 years of age.  You are at high risk for heart disease. What should I know about cancer screening? Depending on your health history and family history, you may need to have cancer screening at various ages. This may include screening for:  Breast cancer.  Cervical cancer.  Colorectal cancer.  Skin cancer.  Lung cancer. What should I know about  heart disease, diabetes, and high blood pressure? Blood pressure and heart disease  High blood pressure causes heart disease and increases the risk of stroke. This is more likely to develop in people who have high blood pressure readings, are of African descent, or are overweight.  Have your blood pressure checked: ? Every 3-5 years if you are 6518-22 years of age. ? Every year if you are 22 years old or older. Diabetes Have regular diabetes screenings. This checks your fasting blood sugar level. Have the screening done:  Once every three years after age 840 if you are at a normal weight and have a low risk for diabetes.  More often and at a younger age if you are overweight or have a high risk for diabetes. What should I know about preventing infection? Hepatitis B If you have a higher risk for hepatitis B, you should be screened for this virus. Talk with your health care provider to find out if you are at risk for hepatitis B infection. Hepatitis C Testing is recommended for:  Everyone born from [birth year] through 1965.  Anyone with known risk factors for hepatitis C. Sexually transmitted infections (STIs)  Get screened for STIs, including gonorrhea and chlamydia, if: ? You are sexually active and are younger than 22 years of age. ? You are older than 22 years of age and your health care provider tells you that you are at risk for this type of infection. ? Your sexual activity has changed since you were last screened, and you are at increased risk for  chlamydia or gonorrhea. Ask your health care provider if you are at risk.  Ask your health care provider about whether you are at high risk for HIV. Your health care provider may recommend a prescription medicine to help prevent HIV infection. If you choose to take medicine to prevent HIV, you should first get tested for HIV. You should then be tested every 3 months for as long as you are taking the medicine. Pregnancy  If you are about to  stop having your period (premenopausal) and you may become pregnant, seek counseling before you get pregnant.  Take 400 to 800 micrograms (mcg) of folic acid every day if you become pregnant.  Ask for birth control (contraception) if you want to prevent pregnancy. Osteoporosis and menopause Osteoporosis is a disease in which the bones lose minerals and strength with aging. This can result in bone fractures. If you are 71 years old or older, or if you are at risk for osteoporosis and fractures, ask your health care provider if you should:  Be screened for bone loss.  Take a calcium or vitamin D supplement to lower your risk of fractures.  Be given hormone replacement therapy (HRT) to treat symptoms of menopause. Follow these instructions at home: Lifestyle  Do not use any products that contain nicotine or tobacco, such as cigarettes, e-cigarettes, and chewing tobacco. If you need help quitting, ask your health care provider.  Do not use street drugs.  Do not share needles.  Ask your health care provider for help if you need support or information about quitting drugs. Alcohol use  Do not drink alcohol if: ? Your health care provider tells you not to drink. ? You are pregnant, may be pregnant, or are planning to become pregnant.  If you drink alcohol: ? Limit how much you use to 0-1 drink a day. ? Limit intake if you are breastfeeding.  Be aware of how much alcohol is in your drink. In the [place] one drink equals one 12 oz bottle of beer (355 mL), one 5 oz glass of wine (148 mL), or one 1 oz glass of hard liquor (44 mL). General instructions  Schedule regular health, dental, and eye exams.  Stay current with your vaccines.  Tell your health care provider if: ? You often feel depressed. ? You have ever been abused or do not feel safe at home. Summary  Adopting a healthy lifestyle and getting preventive care are important in promoting health and wellness.  Follow your  health care provider's instructions about healthy diet, exercising, and getting tested or screened for diseases.  Follow your health care provider's instructions on monitoring your cholesterol and blood pressure. This information is not intended to replace advice given to you by your health care provider. Make sure you discuss any questions you have with your health care provider. Document Revised: 12/04/2018 Document Reviewed: 12/04/2018 Elsevier Patient Education  2021 [employer] Tobacco Use Disorder Tobacco use disorder (TUD) occurs when a person craves, seeks, and uses tobacco, regardless of the consequences. This disorder can cause problems with mental and physical health. It can affect your ability to have healthy relationships, and it can keep you from meeting your responsibilities at work, home, or school. Tobacco may be:  Smoked as a cigarette or cigar.  Inhaled using e-cigarettes.  Smoked in a pipe or hookah.  Chewed as smokeless tobacco.  Inhaled into the nostrils as snuff. Tobacco products contain a dangerous chemical called nicotine, which is very addictive. Nicotine triggers  hormones that make the body feel stimulated and works on areas of the brain that make you feel good. These effects can make it hard for people to quit nicotine. Tobacco contains many other unsafe chemicals that can damage almost every organ in the body. Smoking tobacco also puts others in danger due to fire risk and possible health problems caused by breathing in secondhand smoke. What are the signs or symptoms? Symptoms of TUD may include:  Being unable to slow down or stop your tobacco use.  Spending an abnormal amount of time getting or using tobacco.  Craving tobacco. Cravings may last for up to 6 months after quitting.  Tobacco use that: ? Interferes with your work, school, or home life. ? Interferes with your personal and social relationships. ? Makes you give up activities that you once  enjoyed or found important.  Using tobacco even though you know that it is: ? Dangerous or bad for your health or someone else's health. ? Causing problems in your life.  Needing more and more of the substance to get the same effect (developing tolerance).  Experiencing unpleasant symptoms if you do not use the substance (withdrawal). Withdrawal symptoms may include: ? Depressed, anxious, or irritable mood. ? Difficulty concentrating. ? Increased appetite. ? Restlessness or trouble sleeping.  Using the substance to avoid withdrawal. How is this diagnosed? This condition may be diagnosed based on:  Your current and past tobacco use. Your health care provider may ask questions about how your tobacco use affects your life.  A physical exam. You may be diagnosed with TUD if you have at least two symptoms within a 28-month period. How is this treated? This condition is treated by stopping tobacco use. Many people are unable to quit on their own and need help. Treatment may include:  Nicotine replacement therapy (NRT). NRT provides nicotine without the other harmful chemicals in tobacco. NRT gradually lowers the dosage of nicotine in the body and reduces withdrawal symptoms. NRT is available as: ? Over-the-counter gums, lozenges, and skin patches. ? Prescription mouth inhalers and nasal sprays.  Medicine that acts on the brain to reduce cravings and withdrawal symptoms.  A type of talk therapy that examines your triggers for tobacco use, how to avoid them, and how to cope with cravings (behavioral therapy).  Hypnosis. This may help with withdrawal symptoms.  Joining a support group for others coping with TUD. The best treatment for TUD is usually a combination of medicine, talk therapy, and support groups. Recovery can be a long process. Many people start using tobacco again after stopping (relapse). If you relapse, it does not mean that treatment will not work. Follow these  instructions at home: Lifestyle  Do not use any products that contain nicotine or tobacco, such as cigarettes and e-cigarettes.  Avoid things that trigger tobacco use as much as you can. Triggers include people and situations that usually cause you to use tobacco.  Avoid drinks that contain caffeine, including coffee. These may worsen some withdrawal symptoms.  Find ways to manage stress. Wanting to smoke may cause stress, and stress can make you want to smoke. Relaxation techniques such as deep breathing, meditation, and yoga may help.  Attend support groups as needed. These groups are an important part of long-term recovery for many people. General instructions  Take over-the-counter and prescription medicines only as told by your health care provider.  Check with your health care provider before taking any new prescription or over-the-counter medicines.  Decide on  a friend, family member, or smoking quit-line (such as 1-800-QUIT-NOW in the [place] that you can call or text when you feel the urge to smoke or when you need help coping with cravings.  Keep all follow-up visits as told by your health care provider and therapist. This is important.   Contact a health care provider if:  You are not able to take your medicines as prescribed.  Your symptoms get worse, even with treatment. Summary  Tobacco use disorder (TUD) occurs when a person craves, seeks, and uses tobacco regardless of the consequences.  This condition may be diagnosed based on your current and past tobacco use and a physical exam.  Many people are unable to quit on their own and need help. Recovery can be a long process.  The most effective treatment for TUD is usually a combination of medicine, talk therapy, and support groups. This information is not intended to replace advice given to you by your health care provider. Make sure you discuss any questions you have with your health care provider. Document Revised:  11/28/2017 Document Reviewed: 11/28/2017 Elsevier Patient Education  2021 [employer].

## 2021-02-08 LAB — COMPREHENSIVE METABOLIC PANEL
ALT: 13 IU/L (ref 0–32)
AST: 20 IU/L (ref 0–40)
Albumin/Globulin Ratio: 2.1 (ref 1.2–2.2)
Albumin: 4.7 g/dL (ref 3.9–5.0)
Alkaline Phosphatase: 83 IU/L (ref 44–121)
BUN/Creatinine Ratio: 11 (ref 9–23)
BUN: 9 mg/dL (ref 6–20)
Bilirubin Total: 1.2 mg/dL (ref 0.0–1.2)
CO2: 22 mmol/L (ref 20–29)
Calcium: 9.5 mg/dL (ref 8.7–10.2)
Chloride: 102 mmol/L (ref 96–106)
Creatinine, Ser: 0.85 mg/dL (ref 0.57–1.00)
GFR calc Af Amer: 113 mL/min/{1.73_m2} (ref >59)
GFR calc non Af Amer: 98 mL/min/{1.73_m2} (ref >59)
Globulin, Total: 2.2 g/dL (ref 1.5–4.5)
Glucose: 79 mg/dL (ref 65–99)
Potassium: 4.2 mmol/L (ref 3.5–5.2)
Sodium: 138 mmol/L (ref 134–144)
Total Protein: 6.9 g/dL (ref 6.0–8.5)

## 2021-02-08 LAB — CBC WITH DIFFERENTIAL/PLATELET
Basophils Absolute: 0 10*3/uL (ref 0.0–0.2)
Basos: 1 %
EOS (ABSOLUTE): 0.1 10*3/uL (ref 0.0–0.4)
Eos: 2 %
Hematocrit: 38.4 % (ref 34.0–46.6)
Hemoglobin: 13.2 g/dL (ref 11.1–15.9)
Immature Grans (Abs): 0 10*3/uL (ref 0.0–0.1)
Immature Granulocytes: 0 %
Lymphocytes Absolute: 1.7 10*3/uL (ref 0.7–3.1)
Lymphs: 38 %
MCH: 33 pg (ref 26.6–33.0)
MCHC: 34.4 g/dL (ref 31.5–35.7)
MCV: 96 fL (ref 79–97)
Monocytes Absolute: 0.5 10*3/uL (ref 0.1–0.9)
Monocytes: 11 %
Neutrophils Absolute: 2.2 10*3/uL (ref 1.4–7.0)
Neutrophils: 48 %
Platelets: 299 10*3/uL (ref 150–450)
RBC: 4 x10E6/uL (ref 3.77–5.28)
RDW: 12 % (ref 11.7–15.4)
WBC: 4.5 10*3/uL (ref 3.4–10.8)

## 2021-02-08 LAB — HIV ANTIBODY (ROUTINE TESTING W REFLEX): HIV Screen 4th Generation wRfx: NONREACTIVE

## 2021-02-08 LAB — LIPID PANEL
Chol/HDL Ratio: 1.7 ratio (ref 0.0–4.4)
Cholesterol, Total: 98 mg/dL — ABNORMAL LOW (ref 100–199)
HDL: 59 mg/dL (ref >39)
LDL Chol Calc (NIH): 28 mg/dL (ref 0–99)
Triglycerides: 41 mg/dL (ref 0–149)
VLDL Cholesterol Cal: 11 mg/dL (ref 5–40)

## 2021-02-08 LAB — HEPATITIS C ANTIBODY: Hep C Virus Ab: <0.1 s/co ratio (ref 0.0–0.9)

## 2021-06-07 ENCOUNTER — Other Ambulatory Visit (HOSPITAL_COMMUNITY)
Admission: RE | Admit: 2021-06-07 | Discharge: 2021-06-07 | Disposition: A | Discharge status: Home / Self Care | Payer: Managed Care, Other (non HMO) | Source: Ambulatory Visit | Attending: Nurse Practitioner | Admitting: Nurse Practitioner

## 2021-06-07 ENCOUNTER — Encounter: Payer: Self-pay | Admitting: Nurse Practitioner

## 2021-06-07 ENCOUNTER — Ambulatory Visit (INDEPENDENT_AMBULATORY_CARE_PROVIDER_SITE_OTHER): Payer: Managed Care, Other (non HMO) | Admitting: Nurse Practitioner

## 2021-06-07 ENCOUNTER — Other Ambulatory Visit: Payer: Self-pay

## 2021-06-07 VITALS — BP 125/74 | HR 78 | Ht 70.0 in | Wt 162.0 lb

## 2021-06-07 DIAGNOSIS — Z0000 Encounter for general adult medical examination without abnormal findings: Secondary | ICD-10-CM | POA: Diagnosis not present

## 2021-06-07 DIAGNOSIS — Z1151 Encounter for screening for human papillomavirus (HPV): Secondary | ICD-10-CM | POA: Diagnosis not present

## 2021-06-07 DIAGNOSIS — Z113 Encounter for screening for infections with a predominantly sexual mode of transmission: Secondary | ICD-10-CM | POA: Diagnosis not present

## 2021-06-07 DIAGNOSIS — Z01419 Encounter for gynecological examination (general) (routine) without abnormal findings: Secondary | ICD-10-CM | POA: Insufficient documentation

## 2021-06-07 NOTE — Patient Instructions (Signed)
Health Maintenance, Female Adopting a healthy lifestyle and getting preventive care are important in promoting health and wellness. Ask your health care provider about: The right schedule for you to have regular tests and exams. Things you can do on your own to prevent diseases and keep yourself healthy. What should I know about diet, weight, and exercise? Eat a healthy diet  Eat a diet that includes plenty of vegetables, fruits, low-fat dairy products, and lean protein. Do not eat a lot of foods that are high in solid fats, added sugars, or sodium.  Maintain a healthy weight Body mass index (BMI) is used to identify weight problems. It estimates body fat based on height and weight. Your health care provider can help determineyour BMI and help you achieve or maintain a healthy weight. Get regular exercise Get regular exercise. This is one of the most important things you can do for your health. Most adults should: Exercise for at least 150 minutes each week. The exercise should increase your heart rate and make you sweat (moderate-intensity exercise). Do strengthening exercises at least twice a week. This is in addition to the moderate-intensity exercise. Spend less time sitting. Even light physical activity can be beneficial. Watch cholesterol and blood lipids Have your blood tested for lipids and cholesterol at 22 years of age, then havethis test every 5 years. Have your cholesterol levels checked more often if: Your lipid or cholesterol levels are high. You are older than 22 years of age. You are at high risk for heart disease. What should I know about cancer screening? Depending on your health history and family history, you may need to have cancer screening at various ages. This may include screening for: Breast cancer. Cervical cancer. Colorectal cancer. Skin cancer. Lung cancer. What should I know about heart disease, diabetes, and high blood pressure? Blood pressure and heart  disease High blood pressure causes heart disease and increases the risk of stroke. This is more likely to develop in people who have high blood pressure readings, are of African descent, or are overweight. Have your blood pressure checked: Every 3-5 years if you are 18-39 years of age. Every year if you are 40 years old or older. Diabetes Have regular diabetes screenings. This checks your fasting blood sugar level. Have the screening done: Once every three years after age 40 if you are at a normal weight and have a low risk for diabetes. More often and at a younger age if you are overweight or have a high risk for diabetes. What should I know about preventing infection? Hepatitis B If you have a higher risk for hepatitis B, you should be screened for this virus. Talk with your health care provider to find out if you are at risk forhepatitis B infection. Hepatitis C Testing is recommended for: Everyone born from 1945 through 1965. Anyone with known risk factors for hepatitis C. Sexually transmitted infections (STIs) Get screened for STIs, including gonorrhea and chlamydia, if: You are sexually active and are younger than 22 years of age. You are older than 22 years of age and your health care provider tells you that you are at risk for this type of infection. Your sexual activity has changed since you were last screened, and you are at increased risk for chlamydia or gonorrhea. Ask your health care provider if you are at risk. Ask your health care provider about whether you are at high risk for HIV. Your health care provider may recommend a prescription medicine to help   prevent HIV infection. If you choose to take medicine to prevent HIV, you should first get tested for HIV. You should then be tested every 3 months for as long as you are taking the medicine. Pregnancy If you are about to stop having your period (premenopausal) and you may become pregnant, seek counseling before you get  pregnant. Take 400 to 800 micrograms (mcg) of folic acid every day if you become pregnant. Ask for birth control (contraception) if you want to prevent pregnancy. Osteoporosis and menopause Osteoporosis is a disease in which the bones lose minerals and strength with aging. This can result in bone fractures. If you are 65 years old or older, or if you are at risk for osteoporosis and fractures, ask your health care provider if you should: Be screened for bone loss. Take a calcium or vitamin D supplement to lower your risk of fractures. Be given hormone replacement therapy (HRT) to treat symptoms of menopause. Follow these instructions at home: Lifestyle Do not use any products that contain nicotine or tobacco, such as cigarettes, e-cigarettes, and chewing tobacco. If you need help quitting, ask your health care provider. Do not use street drugs. Do not share needles. Ask your health care provider for help if you need support or information about quitting drugs. Alcohol use Do not drink alcohol if: Your health care provider tells you not to drink. You are pregnant, may be pregnant, or are planning to become pregnant. If you drink alcohol: Limit how much you use to 0-1 drink a day. Limit intake if you are breastfeeding. Be aware of how much alcohol is in your drink. In the [place] one drink equals one 12 oz bottle of beer (355 mL), one 5 oz glass of wine (148 mL), or one 1 oz glass of hard liquor (44 mL). General instructions Schedule regular health, dental, and eye exams. Stay current with your vaccines. Tell your health care provider if: You often feel depressed. You have ever been abused or do not feel safe at home. Summary Adopting a healthy lifestyle and getting preventive care are important in promoting health and wellness. Follow your health care provider's instructions about healthy diet, exercising, and getting tested or screened for diseases. Follow your health care provider's  instructions on monitoring your cholesterol and blood pressure. This information is not intended to replace advice given to you by your health care provider. Make sure you discuss any questions you have with your healthcare provider. Document Revised: 12/04/2018 Document Reviewed: 12/04/2018 Elsevier Patient Education  2022 Elsevier Inc.  

## 2021-06-07 NOTE — Progress Notes (Signed)
Established Patient Office Visit  Subjective:  Patient ID: Katherine Dean, female    DOB: 2001-07-22  Age: 20 y.o. MRN: [id number]  CC:  Chief Complaint  Patient presents with   Medical Management of Chronic Issues    CPE with Pap/STD screen    HPI Katherine Dean presents for .   Encounter for general adult medical examination without abnormal findings  Physical describes: Patient's last physical exam was 1 year ago .  Weight: Appropriate for height (BMI less than 27%) ; yes Blood Pressure: Normal (BP less than 120/80) ; yes Medical History: Patient history reviewed ; Family history reviewed ;  Allergies Reviewed: No change in current allergies ;  Medications Reviewed: Medications reviewed - no changes ; Lipids: Normal lipid levels Smoking: Life-long non-smoker ; yes  Physical Activity: Exercises at least 3 times per week,  Alcohol/Drug Use: Is a non-drinker ; No illicit drug use ;  Patient is not afflicted from Stress Incontinence and Urge Incontinence  Safety: reviewed ; Patient wears a seat belt, has smoke detectors, has carbon monoxide detectors, practices appropriate gun safety, and wears sunscreen with extended sun exposure. Dental Care: biannual cleanings, brushes and flosses daily. Ophthalmology/Optometry: Annual visit.  Hearing loss: none Vision impairments: none   Past Medical History:  Diagnosis Date   Allergy     No past surgical history on file.  Family History  Problem Relation Age of Onset   Heart attack Brother     Social History   Socioeconomic History   Marital status: Single    Spouse name: Not on file   Number of children: Not on file   Years of education: Not on file   Highest education level: Not on file  Occupational History   Not on file  Tobacco Use   Smoking status: Light Smoker    Pack years: 0.00    Types: Cigars   Smokeless tobacco: Never  Vaping Use   Vaping Use: Never used  Substance and Sexual Activity   Alcohol use: Never    Drug use: Never   Sexual activity: Not on file  Other Topics Concern   Not on file  Social History Narrative   Not on file   Social Determinants of Health   Financial Resource Strain: Not on file  Food Insecurity: Not on file  Transportation Needs: Not on file  Physical Activity: Not on file  Stress: Not on file  Social Connections: Not on file  Intimate Partner Violence: Not on file    Outpatient Medications Prior to Visit  Medication Sig Dispense Refill   nicotine (NICODERM CQ - DOSED IN MG/24 HOURS) 21 mg/24hr patch Place 1 patch (21 mg total) onto the skin daily. 28 patch 0   No facility-administered medications prior to visit.    No Known Allergies  ROS Review of Systems  Constitutional: Negative.   HENT: Negative.    Respiratory: Negative.    Gastrointestinal: Negative.   Genitourinary: Negative.   Musculoskeletal: Negative.   Skin: Negative.   Neurological: Negative.   Psychiatric/Behavioral: Negative.    All other systems reviewed and are negative.    Objective:    Physical Exam Vitals and nursing note reviewed.  Constitutional:      Appearance: Normal appearance.  HENT:     Head: Normocephalic.     Right Ear: Ear canal normal.     Left Ear: Ear canal normal.     Nose: Nose normal.     Mouth/Throat:  Mouth: Mucous membranes are moist.     Pharynx: Oropharynx is clear.  Eyes:     Conjunctiva/sclera: Conjunctivae normal.     Pupils: Pupils are equal, round, and reactive to light.  Cardiovascular:     Rate and Rhythm: Normal rate and regular rhythm.     Pulses: Normal pulses.     Heart sounds: Normal heart sounds.  Pulmonary:     Effort: Pulmonary effort is normal.     Breath sounds: Normal breath sounds.  Abdominal:     General: Bowel sounds are normal.  Musculoskeletal:        General: Normal range of motion.  Skin:    General: Skin is warm.     Findings: No rash.  Neurological:     Mental Status: She is alert and oriented to person,  place, and time.  Psychiatric:        Behavior: Behavior normal.  's history  BP 125/74   Pulse 78   Ht 5\' 10"  (1.778 m)   Wt 162 lb (73.5 kg)   SpO2 96%   BMI 23.24 kg/m  Wt Readings from Last 3 Encounters:  06/07/21 162 lb (73.5 kg)  02/07/21 158 lb 3.2 oz (71.8 kg)  05/06/20 165 lb (74.8 kg)     Health Maintenance Due  Topic Date Due   COVID-19 Vaccine (1) Never done   Pneumococcal Vaccine 24-22 Years old (1 - PCV) Never done   HPV VACCINES (1 - 2-dose series) Never done   PAP-Cervical Cytology Screening  Never done   PAP SMEAR-Modifier  Never done       Topic Date Due   HPV VACCINES (1 - 2-dose series) Never done    No results found for: TSH Lab Results  Component Value Date   WBC 4.5 02/07/2021   HGB 13.2 02/07/2021   HCT 38.4 02/07/2021   MCV 96 02/07/2021   PLT 299 02/07/2021   Lab Results  Component Value Date   NA 138 02/07/2021   K 4.2 02/07/2021   CO2 22 02/07/2021   GLUCOSE 79 02/07/2021   BUN 9 02/07/2021   CREATININE 0.85 02/07/2021   BILITOT 1.2 02/07/2021   ALKPHOS 83 02/07/2021   AST 20 02/07/2021   ALT 13 02/07/2021   PROT 6.9 02/07/2021   ALBUMIN 4.7 02/07/2021   CALCIUM 9.5 02/07/2021   Lab Results  Component Value Date   CHOL 98 (L) 02/07/2021   Lab Results  Component Value Date   HDL 59 02/07/2021   Lab Results  Component Value Date   LDLCALC 28 02/07/2021   Lab Results  Component Value Date   TRIG 41 02/07/2021   Lab Results  Component Value Date   CHOLHDL 1.7 02/07/2021   No results found for: HGBA1C    Assessment & Plan:   Problem List Items Addressed This Visit       Other   Annual physical exam - Primary    Completed annual physical exam with Pap.  Patient has no new concerns. Follow-up in 1 year.  Provided education to patient on health maintenance and preventative care.          Relevant Orders   Cytology - PAP([place])   GC/Chlamydia probe amp ([place])not at [facility]    No orders of  the defined types were placed in this encounter.   Follow-up: Return in about 1 year (around 06/07/2022).    06/09/2022, NP

## 2021-06-07 NOTE — Assessment & Plan Note (Signed)
Completed annual physical exam with Pap.  Patient has no new concerns. Follow-up in 1 year.  Provided education to patient on health maintenance and preventative care.

## 2021-06-09 LAB — CYTOLOGY - PAP
Adequacy: ABSENT
Chlamydia: NEGATIVE
Comment: NEGATIVE
Comment: NEGATIVE
Comment: NORMAL
Diagnosis: NEGATIVE
Neisseria Gonorrhea: NEGATIVE
Trichomonas: NEGATIVE

## 2021-06-09 LAB — GC/CHLAMYDIA PROBE AMP (~~LOC~~) NOT AT ARMC
Chlamydia: NEGATIVE
Comment: NEGATIVE
Comment: NORMAL
Neisseria Gonorrhea: NEGATIVE

## 2021-08-18 IMAGING — MR MR LUMBAR SPINE W/O CM
4 of 5 series · 27 of 48 positions shown · non-contrast
Comparison: None.

CLINICAL DATA: Low back pain for over 6 weeks. Pinching pain on the
left side for 4 months.

EXAM:
MRI LUMBAR SPINE WITHOUT CONTRAST
TECHNIQUE: Multiplanar, multisequence MR imaging of the lumbar spine was
performed. No intravenous contrast was administered.

[Series 3: T2 · sagittal · 4.0mm · 1.09mm/px · 6 of 15 slices shown (1 of 2)]
[im 1/15]
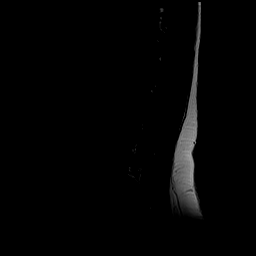
[im 3/15]
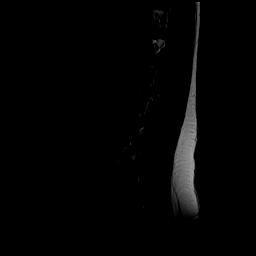
[im 6/15]
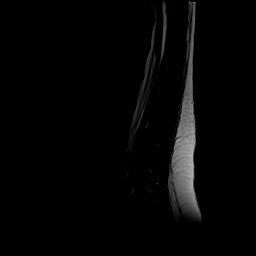
[im 9/15]
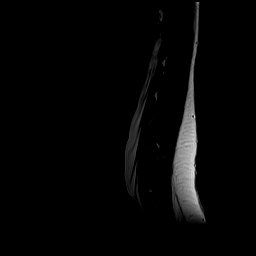
[im 12/15]
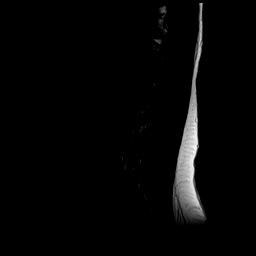
[im 15/15]
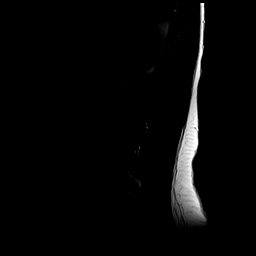

[Series 5: T1 · sagittal · 4.0mm · 1.09mm/px · 6 of 15 slices shown (1 of 2)]
[im 1/15]
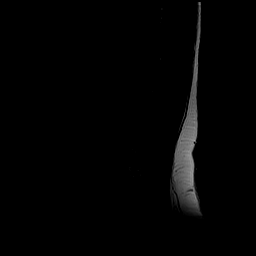
[im 3/15]
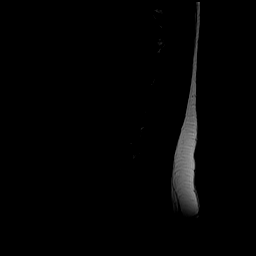
[im 6/15]
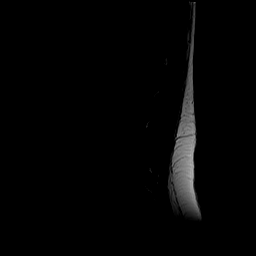
[im 9/15]
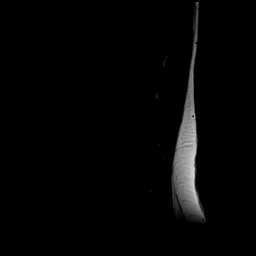
[im 12/15]
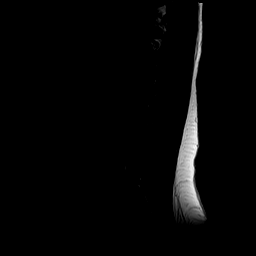
[im 15/15]
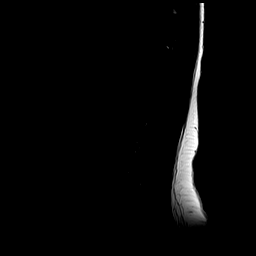

[Series 6: T2 · axial · 4.0mm · 0.39mm/px · z∈[-59,+152]mm · 9 of 41 slices shown (2 of 2)]
[im 1/41]
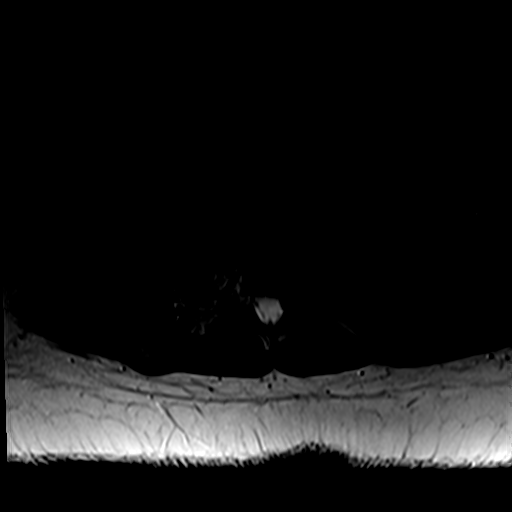
[im 6/41]
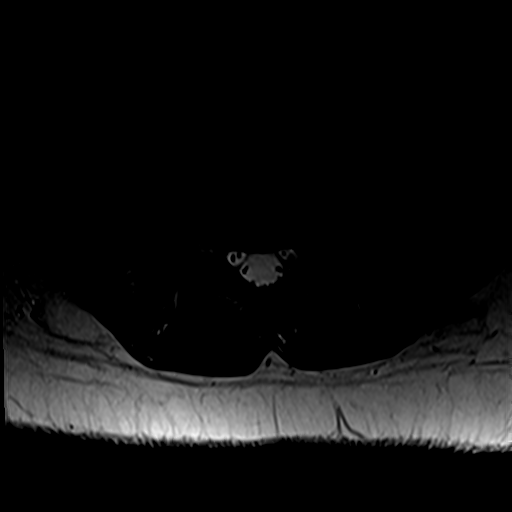
[im 12/41]
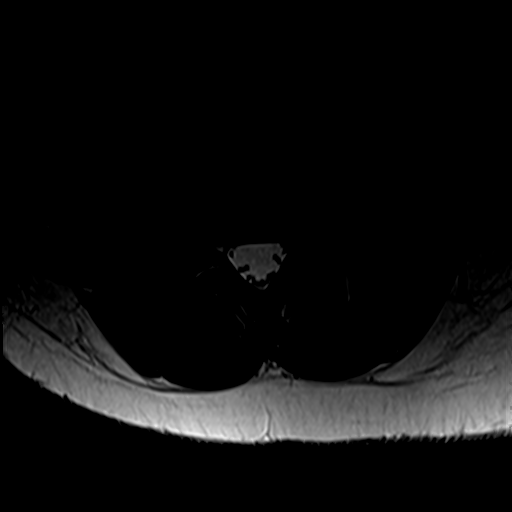
[im 18/41]
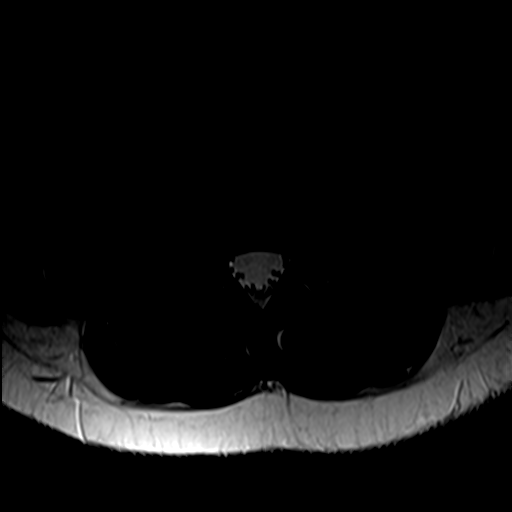
[im 21/41]
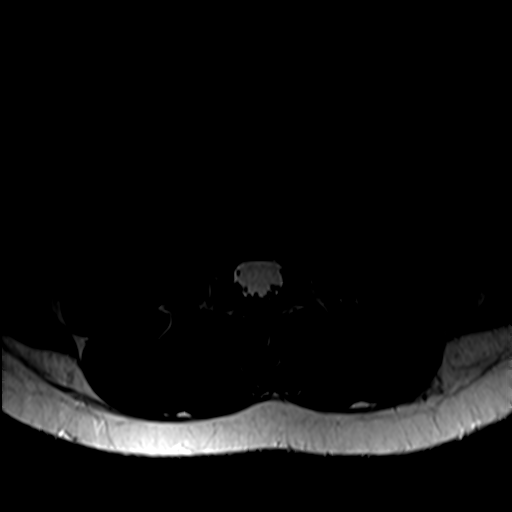
[im 23/41]
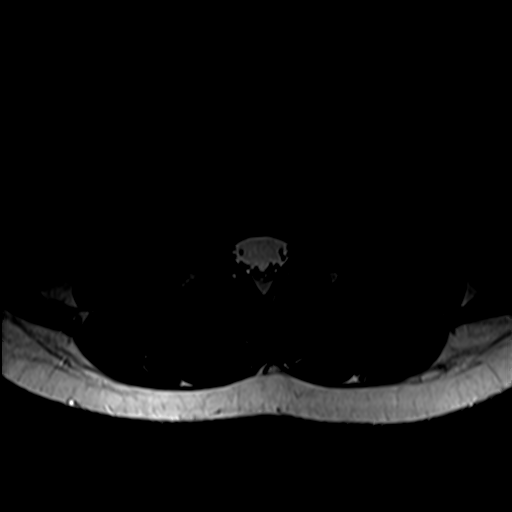
[im 29/41]
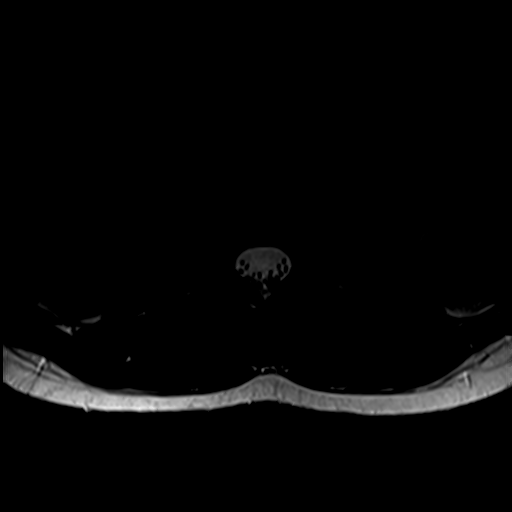
[im 35/41]
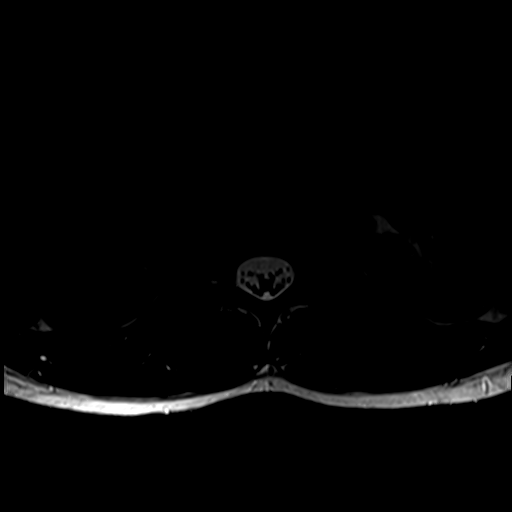
[im 41/41]
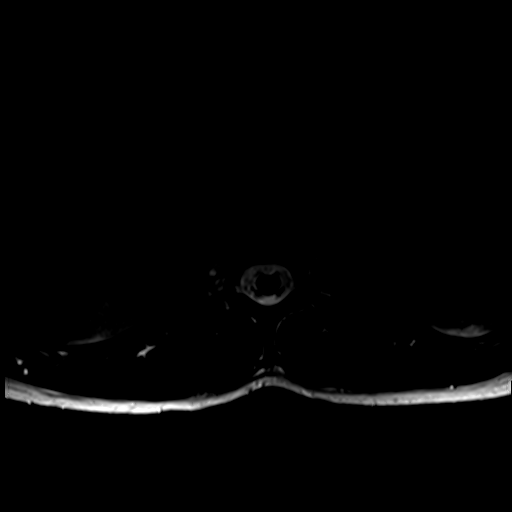

[Series 7: T1 · axial · 4.0mm · 0.39mm/px · z∈[-59,+124]mm · 6 of 41 slices shown (2 of 2)]
[im 1/41]
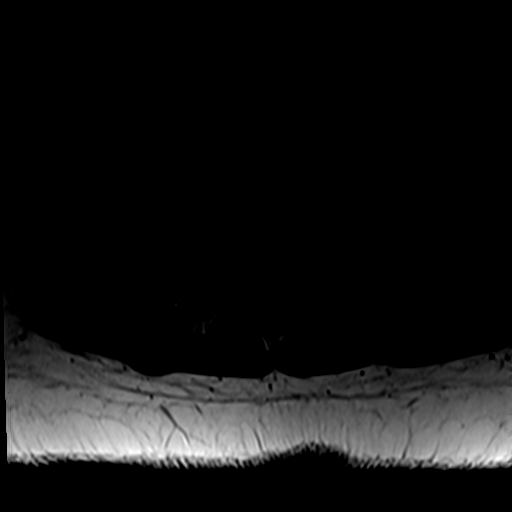
[im 6/41]
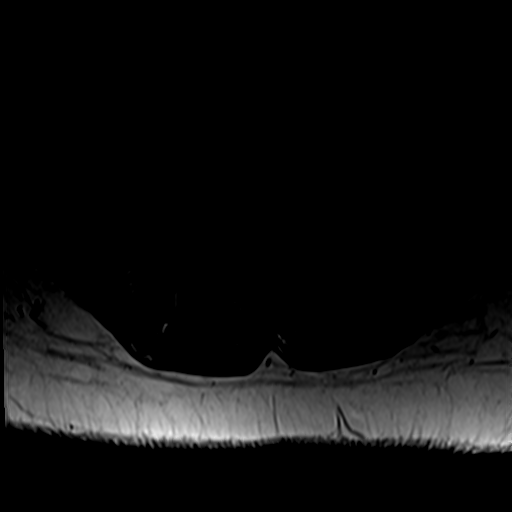
[im 12/41]
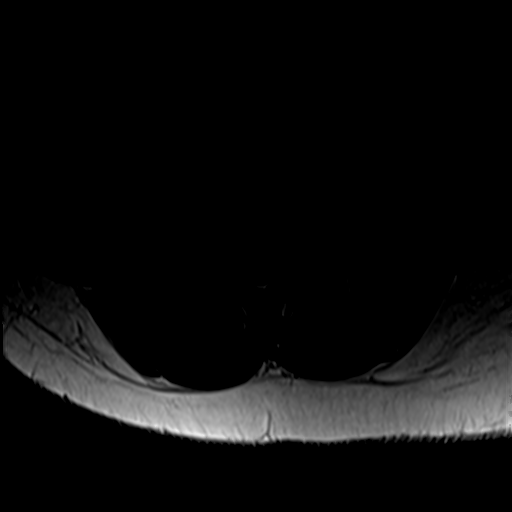
[im 18/41]
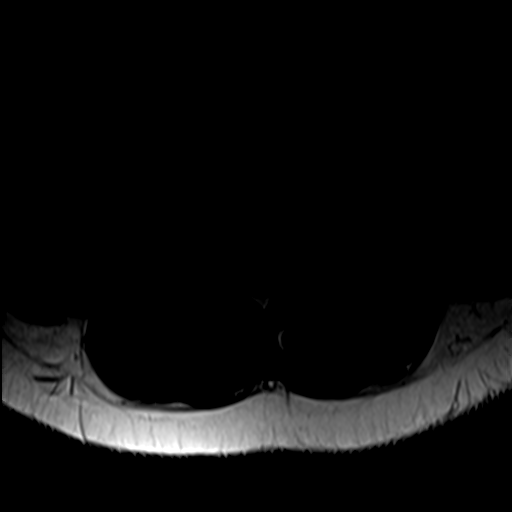
[im 21/41]
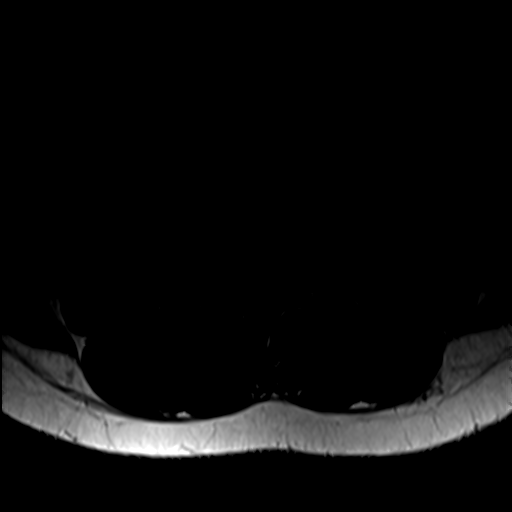
[im 35/41]
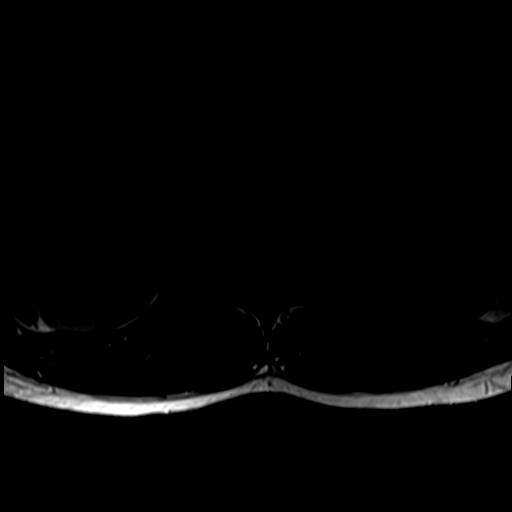

[27 of 48 positions shown; findings below may reference images not displayed]

FINDINGS: Segmentation:  Standard.

Alignment:  Physiologic.

Vertebrae:  No fracture, evidence of discitis, or bone lesion.

Conus medullaris and cauda equina: Conus extends to the T12-L1
level. Conus and cauda equina appear normal.

Paraspinal and other soft tissues: No acute paraspinal abnormality.

Disc levels:

Disc spaces: Disc spaces are preserved.

T12-L1: No significant disc bulge. No evidence of neural foraminal
stenosis. No central canal stenosis.

L1-L2: No significant disc bulge. No evidence of neural foraminal
stenosis. No central canal stenosis.

L2-L3: No significant disc bulge. No evidence of neural foraminal
stenosis. No central canal stenosis.

L3-L4: No significant disc bulge. No evidence of neural foraminal
stenosis. No central canal stenosis.

L4-L5: No significant disc bulge. No evidence of neural foraminal
stenosis. No central canal stenosis.

L5-S1: No significant disc bulge. No evidence of neural foraminal
stenosis. No central canal stenosis.
IMPRESSION: 1. No acute osseous injury of the lumbar spine.
2. No significant lumbar spine disc protrusion, foraminal stenosis
or central canal stenosis.

## 2021-09-09 ENCOUNTER — Ambulatory Visit (INDEPENDENT_AMBULATORY_CARE_PROVIDER_SITE_OTHER): Payer: Managed Care, Other (non HMO) | Admitting: Nurse Practitioner

## 2021-09-09 ENCOUNTER — Other Ambulatory Visit (HOSPITAL_COMMUNITY)
Admission: RE | Admit: 2021-09-09 | Discharge: 2021-09-09 | Disposition: A | Discharge status: Home / Self Care | Payer: Managed Care, Other (non HMO) | Source: Ambulatory Visit | Attending: Nurse Practitioner | Admitting: Nurse Practitioner

## 2021-09-09 ENCOUNTER — Other Ambulatory Visit: Payer: Self-pay

## 2021-09-09 ENCOUNTER — Encounter: Payer: Self-pay | Admitting: Nurse Practitioner

## 2021-09-09 VITALS — BP 102/61 | HR 86 | Temp 97.4°F | Ht 70.0 in | Wt 160.0 lb

## 2021-09-09 DIAGNOSIS — Z7689 Persons encountering health services in other specified circumstances: Secondary | ICD-10-CM | POA: Diagnosis not present

## 2021-09-09 DIAGNOSIS — Z202 Contact with and (suspected) exposure to infections with a predominantly sexual mode of transmission: Secondary | ICD-10-CM | POA: Insufficient documentation

## 2021-09-09 DIAGNOSIS — Z7251 High risk heterosexual behavior: Secondary | ICD-10-CM | POA: Diagnosis not present

## 2021-09-09 NOTE — Progress Notes (Signed)
Acute Office Visit  Subjective:    Patient ID: Katherine Dean, female    DOB: 01-07-2001, 20 y.o.   MRN: [id number]  Chief Complaint  Patient presents with   screening labs    Exposure to STD  The patient's pertinent negatives include no discharge, dyspareunia, dysuria or genital itching. This is a new problem. The current episode started in the past 7 days. The vaginal discharge was normal. Pertinent negatives include no abdominal pain, fever or sore throat. She has tried nothing for the symptoms.   Past Medical History:  Diagnosis Date   Allergy     History reviewed. No pertinent surgical history.  Family History  Problem Relation Age of Onset   Heart attack Brother    Diabetes Maternal Grandmother     Social History   Socioeconomic History   Marital status: Single    Spouse name: Not on file   Number of children: Not on file   Years of education: Not on file   Highest education level: Not on file  Occupational History   Not on file  Tobacco Use   Smoking status: Light Smoker    Types: Cigars   Smokeless tobacco: Never  Vaping Use   Vaping Use: Some days  Substance and Sexual Activity   Alcohol use: Never   Drug use: Yes    Types: Marijuana   Sexual activity: Not Currently  Other Topics Concern   Not on file  Social History Narrative   Not on file   Social Determinants of Health   Financial Resource Strain: Not on file  Food Insecurity: Not on file  Transportation Needs: Not on file  Physical Activity: Not on file  Stress: Not on file  Social Connections: Not on file  Intimate Partner Violence: Not on file    No outpatient medications prior to visit.   No facility-administered medications prior to visit.    No Known Allergies  Review of Systems  Constitutional:  Positive for chills. Negative for fever.  HENT: Negative.  Negative for sore throat.   Eyes: Negative.   Gastrointestinal:  Negative for abdominal pain.  Genitourinary:  Negative for  dyspareunia and dysuria.  Skin:  Positive for rash.  All other systems reviewed and are negative.     Objective:    Physical Exam Vitals and nursing note reviewed.  Constitutional:      Appearance: Normal appearance.  HENT:     Head: Normocephalic.     Nose: Nose normal.  Eyes:     Conjunctiva/sclera: Conjunctivae normal.  Cardiovascular:     Rate and Rhythm: Normal rate and regular rhythm.  Pulmonary:     Effort: Pulmonary effort is normal.     Breath sounds: Normal breath sounds.  Abdominal:     General: Bowel sounds are normal.  Musculoskeletal:        General: Normal range of motion.  Skin:    Findings: Rash present.  Neurological:     Mental Status: She is alert and oriented to person, place, and time.    BP 102/61   Pulse 86   Temp (!) 97.4 F (36.3 C) (Temporal)   Ht 5\' 10"  (1.778 m)   Wt 160 lb (72.6 kg)   LMP 08/26/2021   SpO2 100%   BMI 22.96 kg/m  Wt Readings from Last 3 Encounters:  09/09/21 160 lb (72.6 kg)  06/07/21 162 lb (73.5 kg)  02/07/21 158 lb 3.2 oz (71.8 kg)    Health Maintenance Due  Topic Date Due   COVID-19 Vaccine (1) Never done   Pneumococcal Vaccine 20-68 Years old (1 - PCV) 08/29/2005   HPV VACCINES (1 - 2-dose series) Never done   INFLUENZA VACCINE  Never done       Topic Date Due   HPV VACCINES (1 - 2-dose series) Never done     No results found for: TSH Lab Results  Component Value Date   WBC 4.5 02/07/2021   HGB 13.2 02/07/2021   HCT 38.4 02/07/2021   MCV 96 02/07/2021   PLT 299 02/07/2021   Lab Results  Component Value Date   NA 138 02/07/2021   K 4.2 02/07/2021   CO2 22 02/07/2021   GLUCOSE 79 02/07/2021   BUN 9 02/07/2021   CREATININE 0.85 02/07/2021   BILITOT 1.2 02/07/2021   ALKPHOS 83 02/07/2021   AST 20 02/07/2021   ALT 13 02/07/2021   PROT 6.9 02/07/2021   ALBUMIN 4.7 02/07/2021   CALCIUM 9.5 02/07/2021   Lab Results  Component Value Date   CHOL 98 (L) 02/07/2021   Lab Results  Component  Value Date   HDL 59 02/07/2021   Lab Results  Component Value Date   LDLCALC 28 02/07/2021   Lab Results  Component Value Date   TRIG 41 02/07/2021   Lab Results  Component Value Date   CHOLHDL 1.7 02/07/2021   No results found for: HGBA1C     Assessment & Plan:   Problem List Items Addressed This Visit       Other   Encounter for assessment of STD exposure - Primary    Patient exposed to STD from un-safe sexual practices, completed assessments, resolving rash on right lower leg and left upper thigh, she reports mild night sweats, no fever or any other symptoms associated with concerns. STD labs with HIV completed, results pending.      Relevant Orders   HepB+HepC+HIV Panel   GC/Chlamydia probe amp ([place])not at [facility]   HIV antibody (with reflex)     No orders of the defined types were placed in this encounter.    Daryll Drown, NP

## 2021-09-09 NOTE — Assessment & Plan Note (Signed)
Patient exposed to STD from un-safe sexual practices, completed assessments, resolving rash on right lower leg and left upper thigh, she reports mild night sweats, no fever or any other symptoms associated with concerns. STD labs with HIV completed, results pending.

## 2021-09-09 NOTE — Patient Instructions (Signed)
Safe Sex Practicing safe sex means taking steps before and during sex to reduce your risk of: Getting an STI (sexually transmitted infection). Giving your partner an STI. Unwanted or unplanned pregnancy. How to practice safe sex Ways you can practice safe sex  Limit your sexual partners to only one partner who is having sex with only you. Avoid using alcohol and drugs before having sex. Alcohol and drugs can affect your judgment. Before having sex with a new partner: Talk to your partner about past partners, past STIs, and drug use. Get screened for STIs and discuss the results with your partner. Ask your partner to get screened too. Check your body regularly for sores, blisters, rashes, or unusual discharge. If you notice any of these problems, visit your health care provider. Avoid sexual contact if you have symptoms of an infection or you are being treated for an STI. While having sex, use a condom. Make sure to: Use a condom every time you have vaginal, oral, or anal sex. Both females and males should wear condoms during oral sex. Keep condoms in place from the beginning to the end of sexual activity. Use a latex condom, if possible. Latex condoms offer the best protection. Use only water-based lubricants with a condom. Using petroleum-based lubricants or oils will weaken the condom and increase the chance that it will break. Ways your health care provider can help you practice safe sex  See your health care provider for regular screenings, exams, and tests for STIs. Talk with your health care provider about what kind of birth control (contraception) is best for you. Get vaccinated against hepatitis B and human papillomavirus (HPV). If you are at risk of being infected with HIV (human immunodeficiency virus), talk with your health care provider about taking a prescription medicine to prevent HIV infection. You are at risk for HIV if you: Are a man who has sex with other men. Are  sexually active with more than one partner. Take drugs by injection. Have a sex partner who has HIV. Have unprotected sex. Have sex with someone who has sex with both men and women. Have had an STI. Follow these instructions at home: Take over-the-counter and prescription medicines only as told by your health care provider. Keep all follow-up visits. This is important. Where to find more information Centers for Disease Control and Prevention: www.cdc.gov Planned Parenthood: www.plannedparenthood.org Office on Women's Health: www.womenshealth.gov Summary Practicing safe sex means taking steps before and during sex to reduce your risk getting an STI, giving your partner an STI, and having an unwanted or unplanned pregnancy. Before having sex with a new partner, talk to your partner about past partners, past STIs, and drug use. Use a condom every time you have vaginal, oral, or anal sex. Both females and males should wear condoms during oral sex. Check your body regularly for sores, blisters, rashes, or unusual discharge. If you notice any of these problems, visit your health care provider. See your health care provider for regular screenings, exams, and tests for STIs. This information is not intended to replace advice given to you by your health care provider. Make sure you discuss any questions you have with your health care provider. Document Revised: 05/17/2020 Document Reviewed: 05/17/2020 [employer] Patient Education  2022 [employer]  

## 2021-09-12 LAB — HEPB+HEPC+HIV PANEL
HIV Screen 4th Generation wRfx: NONREACTIVE
Hep B C IgM: NEGATIVE
Hep B Core Total Ab: NEGATIVE
Hep B E Ab: NEGATIVE
Hep B E Ag: NEGATIVE
Hep B Surface Ab, Qual: NONREACTIVE
Hep C Virus Ab: <0.1 s/co ratio (ref 0.0–0.9)
Hepatitis B Surface Ag: NEGATIVE

## 2021-09-12 LAB — GC/CHLAMYDIA PROBE AMP (~~LOC~~) NOT AT ARMC
Chlamydia: NEGATIVE
Comment: NEGATIVE
Comment: NORMAL
Neisseria Gonorrhea: NEGATIVE
